# Patient Record
Sex: Female | Born: 1965 | Race: White | Hispanic: No | Marital: Married | Smoking: Never smoker
Health system: Southern US, Community
[De-identification: ages and names within clinical notes are randomized; demographics above are authoritative.]

## PROBLEM LIST (undated history)

## (undated) DIAGNOSIS — E0590 Thyrotoxicosis, unspecified without thyrotoxic crisis or storm: Secondary | ICD-10-CM

## (undated) DIAGNOSIS — IMO0002 Reserved for concepts with insufficient information to code with codable children: Secondary | ICD-10-CM

## (undated) DIAGNOSIS — J309 Allergic rhinitis, unspecified: Secondary | ICD-10-CM

## (undated) HISTORY — DX: Thyrotoxicosis, unspecified without thyrotoxic crisis or storm: E05.90

## (undated) HISTORY — DX: Reserved for concepts with insufficient information to code with codable children: IMO0002

## (undated) HISTORY — DX: Allergic rhinitis, unspecified: J30.9

---
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
## 1993-01-05 HISTORY — PX: APPENDECTOMY: SHX54

## 2003-07-20 ENCOUNTER — Other Ambulatory Visit: Admission: RE | Admit: 2003-07-20 | Discharge: 2003-07-20 | Payer: Self-pay | Admitting: Obstetrics and Gynecology

## 2004-01-06 LAB — HM MAMMOGRAPHY: HM Mammogram: NORMAL

## 2004-08-26 ENCOUNTER — Other Ambulatory Visit: Admission: RE | Admit: 2004-08-26 | Discharge: 2004-08-26 | Payer: Self-pay | Admitting: Obstetrics and Gynecology

## 2005-03-17 ENCOUNTER — Encounter: Admission: RE | Admit: 2005-03-17 | Discharge: 2005-03-17 | Payer: Self-pay | Admitting: Family Medicine

## 2005-09-29 ENCOUNTER — Other Ambulatory Visit: Admission: RE | Admit: 2005-09-29 | Discharge: 2005-09-29 | Payer: Self-pay | Admitting: Obstetrics and Gynecology

## 2005-10-08 ENCOUNTER — Encounter (INDEPENDENT_AMBULATORY_CARE_PROVIDER_SITE_OTHER): Payer: Self-pay | Admitting: Cardiology

## 2005-10-08 ENCOUNTER — Ambulatory Visit (HOSPITAL_COMMUNITY): Admission: RE | Admit: 2005-10-08 | Discharge: 2005-10-08 | Payer: Self-pay | Admitting: Obstetrics and Gynecology

## 2006-01-05 DIAGNOSIS — IMO0002 Reserved for concepts with insufficient information to code with codable children: Secondary | ICD-10-CM

## 2006-01-05 HISTORY — DX: Reserved for concepts with insufficient information to code with codable children: IMO0002

## 2006-03-16 ENCOUNTER — Inpatient Hospital Stay (HOSPITAL_COMMUNITY): Admission: AD | Admit: 2006-03-16 | Discharge: 2006-03-16 | Payer: Self-pay | Admitting: Obstetrics and Gynecology

## 2006-04-01 ENCOUNTER — Inpatient Hospital Stay (HOSPITAL_COMMUNITY): Admission: RE | Admit: 2006-04-01 | Discharge: 2006-04-04 | Payer: Self-pay | Admitting: Obstetrics and Gynecology

## 2007-01-07 ENCOUNTER — Encounter: Payer: Self-pay | Admitting: Internal Medicine

## 2007-06-06 LAB — CONVERTED CEMR LAB: Pap Smear: NORMAL

## 2008-05-18 ENCOUNTER — Ambulatory Visit: Payer: Self-pay | Admitting: Internal Medicine

## 2008-05-18 DIAGNOSIS — R519 Headache, unspecified: Secondary | ICD-10-CM | POA: Insufficient documentation

## 2008-05-18 DIAGNOSIS — L723 Sebaceous cyst: Secondary | ICD-10-CM | POA: Insufficient documentation

## 2008-05-18 DIAGNOSIS — B373 Candidiasis of vulva and vagina: Secondary | ICD-10-CM | POA: Insufficient documentation

## 2008-05-18 DIAGNOSIS — Z87448 Personal history of other diseases of urinary system: Secondary | ICD-10-CM | POA: Insufficient documentation

## 2008-05-18 DIAGNOSIS — F329 Major depressive disorder, single episode, unspecified: Secondary | ICD-10-CM | POA: Insufficient documentation

## 2008-05-18 DIAGNOSIS — R51 Headache: Secondary | ICD-10-CM | POA: Insufficient documentation

## 2008-07-05 LAB — CONVERTED CEMR LAB

## 2008-08-13 ENCOUNTER — Telehealth: Payer: Self-pay | Admitting: Internal Medicine

## 2008-08-22 ENCOUNTER — Encounter: Payer: Self-pay | Admitting: Internal Medicine

## 2009-10-09 ENCOUNTER — Ambulatory Visit: Payer: Self-pay | Admitting: Internal Medicine

## 2009-10-10 ENCOUNTER — Ambulatory Visit: Payer: Self-pay | Admitting: Internal Medicine

## 2009-10-10 ENCOUNTER — Encounter: Payer: Self-pay | Admitting: Internal Medicine

## 2009-10-10 LAB — CONVERTED CEMR LAB
ALT: 19 units/L (ref 0–35)
AST: 17 units/L (ref 0–37)
Albumin: 3.9 g/dL (ref 3.5–5.2)
Alkaline Phosphatase: 51 units/L (ref 39–117)
BUN: 12 mg/dL (ref 6–23)
Basophils Absolute: 0 10*3/uL (ref 0.0–0.1)
Basophils Relative: 0.5 % (ref 0.0–3.0)
Bilirubin Urine: NEGATIVE
Bilirubin, Direct: 0.2 mg/dL (ref 0.0–0.3)
CO2: 32 meq/L (ref 19–32)
Calcium: 9.6 mg/dL (ref 8.4–10.5)
Chloride: 103 meq/L (ref 96–112)
Cholesterol: 157 mg/dL (ref 0–200)
Creatinine, Ser: 0.7 mg/dL (ref 0.4–1.2)
Eosinophils Absolute: 0.1 10*3/uL (ref 0.0–0.7)
Eosinophils Relative: 2.1 % (ref 0.0–5.0)
GFR calc non Af Amer: 100.62 mL/min (ref >60)
Glucose, Bld: 89 mg/dL (ref 70–99)
HCT: 38 % (ref 36.0–46.0)
HDL: 76.7 mg/dL (ref >39.00)
Hemoglobin, Urine: NEGATIVE
Hemoglobin: 13.3 g/dL (ref 12.0–15.0)
Ketones, ur: NEGATIVE mg/dL
LDL Cholesterol: 66 mg/dL (ref 0–99)
Leukocytes, UA: NEGATIVE
Lymphocytes Relative: 32.2 % (ref 12.0–46.0)
Lymphs Abs: 1.6 10*3/uL (ref 0.7–4.0)
MCHC: 34.9 g/dL (ref 30.0–36.0)
MCV: 91.7 fL (ref 78.0–100.0)
Monocytes Absolute: 0.6 10*3/uL (ref 0.1–1.0)
Monocytes Relative: 10.8 % (ref 3.0–12.0)
Neutro Abs: 2.8 10*3/uL (ref 1.4–7.7)
Neutrophils Relative %: 54.4 % (ref 43.0–77.0)
Nitrite: NEGATIVE
Platelets: 295 10*3/uL (ref 150.0–400.0)
Potassium: 5.5 meq/L — ABNORMAL HIGH (ref 3.5–5.1)
RBC: 4.14 M/uL (ref 3.87–5.11)
RDW: 12.6 % (ref 11.5–14.6)
Sodium: 141 meq/L (ref 135–145)
Specific Gravity, Urine: 1.01 (ref 1.000–1.030)
TSH: 0.03 microintl units/mL — ABNORMAL LOW (ref 0.35–5.50)
Total Bilirubin: 1.2 mg/dL (ref 0.3–1.2)
Total CHOL/HDL Ratio: 2
Total Protein, Urine: NEGATIVE mg/dL
Total Protein: 7 g/dL (ref 6.0–8.3)
Triglycerides: 73 mg/dL (ref 0.0–149.0)
Urine Glucose: NEGATIVE mg/dL
Urobilinogen, UA: 0.2 (ref 0.0–1.0)
VLDL: 14.6 mg/dL (ref 0.0–40.0)
WBC: 5.1 10*3/uL (ref 4.5–10.5)
pH: 8.5 (ref 5.0–8.0)

## 2009-10-14 ENCOUNTER — Telehealth (INDEPENDENT_AMBULATORY_CARE_PROVIDER_SITE_OTHER): Payer: Self-pay | Admitting: *Deleted

## 2009-10-14 LAB — CONVERTED CEMR LAB
Free T4: 1.58 ng/dL (ref 0.60–1.60)
T3, Free: 3.9 pg/mL (ref 2.3–4.2)

## 2009-10-28 ENCOUNTER — Ambulatory Visit: Payer: Self-pay | Admitting: Endocrinology

## 2009-10-28 DIAGNOSIS — E0590 Thyrotoxicosis, unspecified without thyrotoxic crisis or storm: Secondary | ICD-10-CM | POA: Insufficient documentation

## 2009-10-28 HISTORY — DX: Thyrotoxicosis, unspecified without thyrotoxic crisis or storm: E05.90

## 2009-11-26 ENCOUNTER — Ambulatory Visit: Payer: Self-pay | Admitting: Endocrinology

## 2009-11-26 LAB — CONVERTED CEMR LAB
Free T4: 1.61 ng/dL — ABNORMAL HIGH (ref 0.60–1.60)
TSH: 0.02 microintl units/mL — ABNORMAL LOW (ref 0.35–5.50)

## 2009-12-03 ENCOUNTER — Telehealth: Payer: Self-pay | Admitting: Endocrinology

## 2010-01-09 ENCOUNTER — Other Ambulatory Visit: Payer: Self-pay | Admitting: Internal Medicine

## 2010-01-09 ENCOUNTER — Ambulatory Visit
Admission: RE | Admit: 2010-01-09 | Discharge: 2010-01-09 | Payer: Self-pay | Source: Home / Self Care | Attending: Internal Medicine | Admitting: Internal Medicine

## 2010-01-09 ENCOUNTER — Telehealth: Payer: Self-pay | Admitting: Internal Medicine

## 2010-01-09 LAB — T4, FREE: Free T4: 0.89 ng/dL (ref 0.60–1.60)

## 2010-01-09 LAB — TSH: TSH: 0.02 u[IU]/mL — ABNORMAL LOW (ref 0.35–5.50)
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
## 2010-02-05 NOTE — Progress Notes (Signed)
----   Converted from flag ---- ---- 10/14/2009 2:05 PM, Ivar Bury wrote: Gave pt 10/28/09 @ 11a w/Dr Allie Dimmer  ---- 10/14/2009 9:30 AM, Dagoberto Reef wrote: Please schedule with Dr Everardo All.  Thanks  ---- 10/14/2009 8:37 AM, Newt Lukes MD wrote: The following orders have been entered for this patient and placed on Admin Hold:  Type:     Referral       Code:   Endocrine Description:   Endocrinology Referral Order Date:   10/14/2009   Authorized By:   Newt Lukes MD Order #:   [id number] Clinical Notes:   ellison - eval low TSH with normal FT4 - ?tx ------------------------------
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
## 2010-02-05 NOTE — Progress Notes (Signed)
Summary: Pharmacy change  Prescriptions: METHIMAZOLE 5 MG TABS (METHIMAZOLE) 1 tab once daily  #90 x 0   Entered by:   Margaret Pyle, CMA   Authorized by:   Minus Breeding MD   Signed by:   Margaret Pyle, CMA on 12/03/2009   Method used:   Electronically to        [id number] (retail)       [address]       [place], [place]  [address]       Ph: [phone] or [phone]       Fax: [phone]   RxID:   [id number]
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
## 2010-02-06 NOTE — Progress Notes (Signed)
Summary: Jennifer Allen  Phone Note Call from Patient Call back at Home Phone [phone]   Caller: Patient Summary of Call: Pt called stating that she had sinus pressure with green nasal discharge and mild ear pain. Pt is sure she has sinus infection but is unable to come in for OV today. Pt is going out of town this afternoon and is requesting that Dr Felicity Coyer please consider this and Rx ABX fro her today. Please advise Initial call taken by: Margaret Pyle, CMA,  January 09, 2010 9:35 AM  Follow-up for Phone Call        we do not typically rx abx without OV - but erx for zpak done given circumstances - if not improved after tx, needs OV, sooner if symptoms worse - thanks Follow-up by: Newt Lukes MD,  January 09, 2010 9:48 AM  Additional Follow-up for Phone Call Additional follow up Details #1::        seen in OV - see same today -  Additional Follow-up by: Newt Lukes MD,  January 09, 2010 10:52 AM    New/Updated Medications: AZITHROMYCIN 250 MG TABS (AZITHROMYCIN) 2 tabs by mouth today, then 1 by mouth daily starting tomorrow Prescriptions: AZITHROMYCIN 250 MG TABS (AZITHROMYCIN) 2 tabs by mouth today, then 1 by mouth daily starting tomorrow  #6 x 0   Entered and Authorized by:   Newt Lukes MD   Signed by:   Newt Lukes MD on 01/09/2010   Method used:   Electronically to        [id number] (retail)       [address]       [place], [place]  [address]       Ph: [phone] or [phone]       Fax: [phone]   RxID:   [id number]

## 2010-02-26 ENCOUNTER — Telehealth (INDEPENDENT_AMBULATORY_CARE_PROVIDER_SITE_OTHER): Payer: Self-pay | Admitting: *Deleted

## 2010-02-27 ENCOUNTER — Encounter (INDEPENDENT_AMBULATORY_CARE_PROVIDER_SITE_OTHER): Payer: Self-pay | Admitting: *Deleted

## 2010-02-27 ENCOUNTER — Other Ambulatory Visit: Payer: BC Managed Care – PPO

## 2010-02-27 ENCOUNTER — Other Ambulatory Visit: Payer: Self-pay | Admitting: Endocrinology

## 2010-02-27 DIAGNOSIS — E0591 Thyrotoxicosis, unspecified with thyrotoxic crisis or storm: Secondary | ICD-10-CM

## 2010-02-27 LAB — TSH: TSH: 0.03 u[IU]/mL — ABNORMAL LOW (ref 0.35–5.50)

## 2010-02-27 LAB — T4, FREE: Free T4: 0.77 ng/dL (ref 0.60–1.60)

## 2010-02-28 ENCOUNTER — Ambulatory Visit (INDEPENDENT_AMBULATORY_CARE_PROVIDER_SITE_OTHER): Payer: BC Managed Care – PPO | Admitting: Endocrinology

## 2010-02-28 ENCOUNTER — Encounter: Payer: Self-pay | Admitting: Endocrinology

## 2010-02-28 DIAGNOSIS — E0590 Thyrotoxicosis, unspecified without thyrotoxic crisis or storm: Secondary | ICD-10-CM

## 2010-03-04 NOTE — Progress Notes (Signed)
  Phone Note Call from Patient Call back at Home Phone [phone]   Caller: Patient---[phone] Call For: Dr Everardo All Summary of Call: Pt made appt for 2/24 for f/u, pt states she needs labs prior, what labs would you like her to have before 2/24?Please let pt know that she can get labs. Initial call taken by: Verdell Face,  February 26, 2010 11:22 AM  Follow-up for Phone Call        free t4 and tsh 242.9 Follow-up by: Minus Breeding MD,  February 26, 2010 1:36 PM  Additional Follow-up for Phone Call Additional follow up Details #1::        Elnita Maxwell can you sechedule these labs for this pt? Thanks! Additional Follow-up by: Margaret Pyle, CMA,  February 26, 2010 1:44 PM    Additional Follow-up for Phone Call Additional follow up Details #2::    orders in [facility] for 2/23, pt aware. Follow-up by: Verdell Face,  February 26, 2010 2:06 PM
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
## 2010-04-25 ENCOUNTER — Other Ambulatory Visit: Payer: BC Managed Care – PPO

## 2010-04-29 ENCOUNTER — Ambulatory Visit: Payer: BC Managed Care – PPO | Admitting: Endocrinology

## 2010-05-23 NOTE — Discharge Summary (Signed)
NAMEAINO, HECKERT               ACCOUNT NO.:  [id number]   MEDICAL RECORD NO.:  [id number]          PATIENT TYPE:  INP   LOCATION:  9130                          FACILITY:  [facility]   PHYSICIAN:  Huel Cote, M.D. DATE OF BIRTH:  July 11, 1965   DATE OF ADMISSION:  04/01/2006  DATE OF DISCHARGE:  04/04/2006                               DISCHARGE SUMMARY   DISCHARGE DIAGNOSES:  1. Term pregnancy at 39 weeks, delivered.  2. Persistent breech presentation failing external version.  3. Status post primary low transverse cesarean section with double      layer closure of uterus.   DISCHARGE MEDICATIONS:  1. Motrin 600 mg p.o. q.6h.  2. Percocet 1-2 tablets p.o. q.4h. p.r.n.  3. Iron sulfate 325 mg p.o. daily.   DISCHARGE FOLLOWUP:  Patient is to follow up in my office in 2 weeks for  an incision check.   HOSPITAL COURSE:  The patient is a 45 year old G1, P0, who came in at 39  weeks for scheduled cesarean section given persistent breech  presentation.  For her full History and Physical, please see History and  Physical previously dictated.  She underwent a low transverse cesarean  section on April 01, 2006, and was delivered of a viable female infant  from the breech presentation.  Apgars were 8 and 9, weight was 7 pounds,  5 ounces.  She had normal ovaries and tubes noted.  The uterus was  normal except for a small serosal fibroid.  She was then admitted for  routine postoperative care.  On postop day #2 she was ambulating,  tolerating a regular diet and feeling well.  Her hemoglobin went from  12.3 down to 8.7, which was not unexpected given a slightly excessive  blood loss at the time of C-section, with several large sinuses bleeding  on the anterior surface of the uterus.  She was placed on iron sulfate  and continued her postop recovery.  By postop day #3, she was doing  quite well, her pain was controlled, she was voiding without difficulty  and tolerating a regular diet.   Her incision was clear.  Her staples  were removed and Steri-Strips placed and it was decided that she was  stable for discharge home.  She was discharged with followup in my  office planned in 2 weeks for an incision check.      Huel Cote, M.D.  Electronically Signed     KR/MEDQ  D:  04/04/2006  T:  04/04/2006  Job:  [id number]

## 2010-05-23 NOTE — Op Note (Signed)
Jennifer Allen, Jennifer Allen               ACCOUNT NO.:  [id number]   MEDICAL RECORD NO.:  [id number]          PATIENT TYPE:  INP   LOCATION:  9130                          [facility]:  [facility]   PHYSICIAN:  Huel Cote, M.D. DATE OF BIRTH:  [birth year]   DATE OF PROCEDURE:  DATE OF DISCHARGE:                               OPERATIVE REPORT   PREOPERATIVE DIAGNOSES:  1. Term pregnancy at 39+ weeks.  2. Breech presentation.   POSTOPERATIVE DIAGNOSES:  1. Term pregnancy at 39+ weeks.  2. Breech presentation.   PROCEDURE:  Primary low transverse C-section with double layer closure  of the uterus.   SURGEON:  Dr. Huel Cote   ASSISTANT:  Dr. Tracey Harries   ANESTHESIA:  Spinal.   SPECIMENS:  Placenta was sent to labor.   ESTIMATED BLOOD LOSS:  800 mL.   URINE OUTPUT:  400 mL clear.   IV FLUIDS:  3 liters of LR.   FINDINGS:  Viable female infant in the breech presentation.  Apgars were  8 and 9.  Weight 7 pounds 5 ounces.  Normal ovaries were noted.  The  uterus had a normal cavity.  There was 1 small serosal fibroid palpated  and was approximately 1-2 cm in size.   PROCEDURE:  The patient was taken to the operating room where spinal  anesthesia was obtained without difficulty.  She was then prepped and  draped in the normal sterile fashion in the dorsal supine position with  a leftward tilt.  A Pfannenstiel skin incision was then made  approximately 2 cm above the symphysis pubis and carried through to the  underlying layer of fascia by sharp dissection and Bovie cautery.  Fascia was opened in the midline, and the incision was extended  laterally with Mayo scissors.  The inferior aspect of the incision was  grasped with Kocher clamps, elevated, and dissected off the underlying  rectus muscles.  The superior aspect was likewise elevated and dissected  off the rectus muscles.  The rectus muscles were separated in the  midline and the peritoneal cavity entered bluntly.   Peritoneal incision  was then made, extended both superiorly and inferiorly with careful  attention to avoid both bowel and bladder.  The inferior aspect of the  incision was then incised with the scalpel and the bladder flap pushed  away.  There were several large sinuses on the anterior surface of the  uterus which did bleed a little more heavily than usual; however, the  cavity itself was able to be entered bluntly, and the uterine incision  was extended bluntly.  The infant was delivered in the breech  presentation with the legs carefully reduced as well as the arms reduced  in a forward sweep across the chest.  The head was delivered in a flexed  motion, the infant bulb suctioned, the cord clamped and the infant  handed to the awaiting pediatricians.  The cord blood was then obtained  and the placenta delivered manually given that there was some excessive  bleeding from the uterine sinuses.  The uterus itself was then incised  with good  contraction, and the uterine incision was closed in 2 layers,  the first a running locked layer of 0 chromic, the second an imbricating  layer of the same suture.  At this point, the incision was hemostatic.  There was no excessive active bleeding noted.  The ovaries and tubes  were inspected and found to be normal with a small serosal fibroid that  was noted on the anterior surface of the fundus.  The remainder of the  pelvis appeared normal.  Therefore, all instruments and sponges were  removed from the pelvis as well as the retractor.  The rectus muscles  were then reapproximated with several mattress sutures of 0 Vicryl in an  interrupted fashion.  The fascia was closed with 0 Vicryl in running  fashion.  The skin was closed with staples.  Sponge, lap, and needle  counts were correct.  The patient was taken to the recovery room in  stable condition.      Huel Cote, M.D.  Electronically Signed     KR/MEDQ  D:  04/01/2006  T:   04/01/2006  Job:  [id number]

## 2010-05-23 NOTE — H&P (Signed)
Jennifer Allen, Jennifer Allen NO.:  [id number]   MEDICAL RECORD NO.:  [id number]          PATIENT TYPE:  INP   LOCATION:  NA                            [facility]:  [facility]   PHYSICIAN:  Huel Cote, M.D. DATE OF BIRTH:  Sep 06, 1965   DATE OF ADMISSION:  DATE OF DISCHARGE:                              HISTORY & PHYSICAL   Surgery to take place on March 27th, 2008 at the [facility]  fac[facility].   Patient is a 45 year old G1, P0, who is coming in at 39+ weeks with a  estimated due date of April 03, 2006 for a scheduled C-section, given a  persistent breech presentation which failed external cephalic version.  Patient's prenatal care otherwise has been uncomplicated.  She has no  past obstetrical history.  Her prenatal labs are as follows:  AB  positive, antibody negative, RPR nonreactive, Rubella immune, Hepatitis  B surface antigen negative, HIV declined.  GC negative, chlamydia  negative, group B strep negative.  One-hour Glucola 115.   PAST MEDICAL HISTORY:  The patient had a possible history of mitral  valve prolapse, however, we did have a echocardiogram performed in  October which was normal and demonstrated no mitral valve prolapse.  She  also had a history of depression; however, had done well off of her  medications throughout the pregnancy.   PAST SURGICAL HISTORY:  In 1995, she had an appendectomy and in 1985 a  tonsillectomy.   PAST GYNECOLOGICAL HISTORY:  None with no abnormal Pap smears.   PHYSICAL EXAMINATION:  VITAL SIGNS:  Patient is 172 pounds, blood  pressure 110/80.  CARDIAC:  Regular rate and rhythm.  LUNGS:  Clear.  ABDOMEN:  Soft, gravid, nontender.  Fundal height is 36.  Cervix is 50  closed and a high station.   The patient was counseled as to the risks and benefits of proceeding  with C-section, after she failed an attempted version at approximately  37 weeks.  The patient understands the risks for bleeding, infection and  possible  damage to bowel and bladder; however, realized that this is the  safest approach for delivery of her breech baby.  We will continue to  follow the patient until the day of the surgery, and if the baby  spontaneously rotates to vertex, obviously would be a candidate for  labor; however, if she remains breech, we will proceed with a primary C-  section as stated.      Huel Cote, M.D.  Electronically Signed     KR/MEDQ  D:  03/25/2006  T:  03/25/2006  Job:  [id number]

## 2010-09-17 ENCOUNTER — Telehealth: Payer: Self-pay | Admitting: *Deleted

## 2010-09-17 NOTE — Telephone Encounter (Signed)
Per MD, pt is due for F/U OV (received lab work from OB/GYN). Left message for pt to callback office.

## 2010-09-18 NOTE — Telephone Encounter (Signed)
Left message for pt to callback office.  

## 2010-09-19 NOTE — Telephone Encounter (Signed)
Left message for pt to callback office.  

## 2010-09-24 NOTE — Telephone Encounter (Signed)
Pt states that she cannot afford to come in for OV at this current time and that she will callback when she is able to.

## 2010-10-14 ENCOUNTER — Other Ambulatory Visit: Payer: Self-pay | Admitting: Internal Medicine

## 2011-01-06 LAB — HM PAP SMEAR

## 2011-05-26 ENCOUNTER — Telehealth: Payer: Self-pay | Admitting: *Deleted

## 2011-05-26 DIAGNOSIS — Z0000 Encounter for general adult medical examination without abnormal findings: Secondary | ICD-10-CM

## 2011-05-26 DIAGNOSIS — E0590 Thyrotoxicosis, unspecified without thyrotoxic crisis or storm: Secondary | ICD-10-CM

## 2011-05-26 NOTE — Telephone Encounter (Signed)
Called pt back no answer LMOM labs entered in epic can come 3 days prior to cpx appt...05/26/11@1 :51pm/lmb

## 2011-05-26 NOTE — Telephone Encounter (Signed)
ok done

## 2011-05-26 NOTE — Telephone Encounter (Signed)
Pt cpx for august wanting to get T4 and T3 added to cpx labs...05/26/11@2 :36pm/LMB

## 2011-05-26 NOTE — Telephone Encounter (Signed)
Message copied by Deatra James on Tue May 26, 2011  1:45 PM ------      Message from: Newell Coral      Created: Tue May 26, 2011  1:32 PM      Regarding: cpe sch       This pt scheduled her cpe and is hoping to get her labs before

## 2011-05-26 NOTE — Telephone Encounter (Signed)
Pt has been notified... 05/26/11@3 :36pm/LMB

## 2011-06-23 ENCOUNTER — Encounter: Payer: Self-pay | Admitting: Internal Medicine

## 2011-06-29 ENCOUNTER — Other Ambulatory Visit: Payer: Self-pay

## 2011-06-29 MED ORDER — FLUOXETINE HCL 20 MG PO TABS: 20.0000 mg | ORAL_TABLET | Freq: Every day | ORAL | Status: DC

## 2011-07-06 ENCOUNTER — Telehealth: Payer: Self-pay | Admitting: Internal Medicine

## 2011-07-06 NOTE — Telephone Encounter (Signed)
Pt calling because she has made an appt but it is not until August. She only has 2 pills left of Fluoxetine. She wants to know if she can get a rx to local pharmacy to last until appt. Can send to [facility] [address]

## 2011-07-07 MED ORDER — FLUOXETINE HCL 20 MG PO TABS: 20.0000 mg | ORAL_TABLET | Freq: Every day | ORAL | Status: DC

## 2011-08-04 ENCOUNTER — Other Ambulatory Visit (INDEPENDENT_AMBULATORY_CARE_PROVIDER_SITE_OTHER): Payer: BC Managed Care – PPO

## 2011-08-04 DIAGNOSIS — E0590 Thyrotoxicosis, unspecified without thyrotoxic crisis or storm: Secondary | ICD-10-CM

## 2011-08-04 DIAGNOSIS — Z0000 Encounter for general adult medical examination without abnormal findings: Secondary | ICD-10-CM

## 2011-08-04 LAB — CBC WITH DIFFERENTIAL/PLATELET
Basophils Relative: 0.4 % (ref 0.0–3.0)
Eosinophils Relative: 2.4 % (ref 0.0–5.0)
HCT: 39 % (ref 36.0–46.0)
Hemoglobin: 12.9 g/dL (ref 12.0–15.0)
Lymphs Abs: 2.1 10*3/uL (ref 0.7–4.0)
MCV: 93.2 fl (ref 78.0–100.0)
Monocytes Absolute: 0.7 10*3/uL (ref 0.1–1.0)
Monocytes Relative: 8.8 % (ref 3.0–12.0)
Neutro Abs: 4.4 10*3/uL (ref 1.4–7.7)
WBC: 7.4 10*3/uL (ref 4.5–10.5)

## 2011-08-04 LAB — TSH: TSH: 0.18 u[IU]/mL — ABNORMAL LOW (ref 0.35–5.50)

## 2011-08-04 LAB — LIPID PANEL
Cholesterol: 186 mg/dL (ref 0–200)
LDL Cholesterol: 84 mg/dL (ref 0–99)
VLDL: 30.6 mg/dL (ref 0.0–40.0)

## 2011-08-04 LAB — URINALYSIS, ROUTINE W REFLEX MICROSCOPIC
Bilirubin Urine: NEGATIVE
Ketones, ur: NEGATIVE
Leukocytes, UA: NEGATIVE
Urobilinogen, UA: 0.2 (ref 0.0–1.0)
pH: 6 (ref 5.0–8.0)

## 2011-08-04 LAB — BASIC METABOLIC PANEL
BUN: 18 mg/dL (ref 6–23)
Chloride: 101 mEq/L (ref 96–112)
GFR: 79.26 mL/min (ref >60.00)
Potassium: 4.2 mEq/L (ref 3.5–5.1)
Sodium: 139 mEq/L (ref 135–145)

## 2011-08-04 LAB — HEPATIC FUNCTION PANEL
ALT: 15 U/L (ref 0–35)
AST: 12 U/L (ref 0–37)
Albumin: 4 g/dL (ref 3.5–5.2)
Total Protein: 7.2 g/dL (ref 6.0–8.3)

## 2011-08-04 LAB — T4, FREE: Free T4: 0.84 ng/dL (ref 0.60–1.60)

## 2011-08-06 ENCOUNTER — Encounter: Payer: Self-pay | Admitting: Internal Medicine

## 2011-08-06 ENCOUNTER — Ambulatory Visit (INDEPENDENT_AMBULATORY_CARE_PROVIDER_SITE_OTHER): Payer: BC Managed Care – PPO | Admitting: Internal Medicine

## 2011-08-06 VITALS — BP 102/72 | HR 80 | Temp 98.9°F | Ht 65.5 in | Wt 166.1 lb

## 2011-08-06 DIAGNOSIS — E0590 Thyrotoxicosis, unspecified without thyrotoxic crisis or storm: Secondary | ICD-10-CM

## 2011-08-06 DIAGNOSIS — Z1239 Encounter for other screening for malignant neoplasm of breast: Secondary | ICD-10-CM

## 2011-08-06 DIAGNOSIS — Z0000 Encounter for general adult medical examination without abnormal findings: Secondary | ICD-10-CM

## 2011-08-06 DIAGNOSIS — E049 Nontoxic goiter, unspecified: Secondary | ICD-10-CM

## 2011-08-06 MED ORDER — FLUOXETINE HCL 20 MG PO TABS: 20.0000 mg | ORAL_TABLET | Freq: Every day | ORAL | Status: DC

## 2011-08-06 NOTE — Assessment & Plan Note (Signed)
On MMZ 2012 but stopped independently T3/FT4 normal despite mildly suppressed TSH Check thyroid ultrasound given goiter on exam and monitor labs q6-61mo

## 2011-08-06 NOTE — Progress Notes (Signed)
Subjective:    Patient ID: Jennifer Allen, female    DOB: 12-Mar-1965, 46 y.o.   MRN: [id number]  HPI  patient is here today for annual physical. Patient feels well and has no complaints.  Past Medical History  Diagnosis Date  . HYPERTHYROIDISM 10/28/2009    incidental CPX dx  . POSTPARTUM DEPRESSION 2008  . Allergic rhinitis, cause unspecified    Family History  Problem Relation Age of Onset  . Arthritis Mother   . Hypothyroidism Mother   . Fibromyalgia Mother   . Arthritis Father   . Alcohol abuse Other   . Arthritis Other   . Prostate cancer Other    History  Substance Use Topics  . Smoking status: Never Smoker   . Smokeless tobacco: Not on file   Comment: Married, lives with spouse +dtr. Artist, master degree  . Alcohol Use: No     Review of Systems Constitutional: Negative for fever or weight change.  Respiratory: Negative for cough and shortness of breath.   Cardiovascular: Negative for chest pain or palpitations.  Gastrointestinal: Negative for abdominal pain, no bowel changes.  Musculoskeletal: Negative for gait problem or joint swelling.  Skin: Negative for rash.  Neurological: Negative for dizziness or headache.  No other specific complaints in a complete review of systems (except as listed in HPI above).     Objective:   Physical Exam BP 102/72  Pulse 80  Temp 98.9 F (37.2 C) (Oral)  Ht 5' 5.5" (1.664 m)  Wt 166 lb 2 oz (75.354 kg)  BMI 27.22 kg/m2  SpO2 97%  LMP 07/06/2011 Wt Readings from Last 3 Encounters:  08/06/11 166 lb 2 oz (75.354 kg)  02/28/10 166 lb 8 oz (75.524 kg)  01/09/10 157 lb (71.215 kg)   Constitutional: She appears well-developed and well-nourished. No distress.  HENT: Head: Normocephalic and atraumatic. Ears: B TMs ok, no erythema or effusion; Nose: Nose normal. Mouth/Throat: Oropharynx is clear and moist. No oropharyngeal exudate.  Eyes: Conjunctivae and EOM are normal. Pupils are equal, round, and reactive to light. No  scleral icterus.  Neck: Normal range of motion. Neck supple. No JVD present. Goiter R>L, nontender.  Cardiovascular: Normal rate, regular rhythm and normal heart sounds.  No murmur heard. No BLE edema. Pulmonary/Chest: Effort normal and breath sounds normal. No respiratory distress. She has no wheezes.  Abdominal: Soft. Bowel sounds are normal. She exhibits no distension. There is no tenderness. no masses GU: defer to gyn Musculoskeletal: Normal range of motion, no joint effusions. No gross deformities Neurological: She is alert and oriented to person, place, and time. No cranial nerve deficit. Coordination normal.  Skin: Skin is warm and dry. No rash noted. No erythema.  Psychiatric: She has a normal mood and affect. Her behavior is normal. Judgment and thought content normal.   Lab Results  Component Value Date   WBC 7.4 08/04/2011   HGB 12.9 08/04/2011   HCT 39.0 08/04/2011   PLT 331.0 08/04/2011   GLUCOSE 87 08/04/2011   CHOL 186 08/04/2011   TRIG 153.0* 08/04/2011   HDL 71.30 08/04/2011   LDLCALC 84 08/04/2011   ALT 15 08/04/2011   AST 12 08/04/2011   NA 139 08/04/2011   K 4.2 08/04/2011   CL 101 08/04/2011   CREATININE 0.8 08/04/2011   BUN 18 08/04/2011   CO2 29 08/04/2011   TSH 0.18* 08/04/2011        Assessment & Plan:  CPX/v70.0 - Patient has been counseled on age-appropriate routine  health concerns for screening and prevention. These are reviewed and up-to-date. Immunizations are up-to-date or declined. Labs reviewed.

## 2011-08-06 NOTE — Patient Instructions (Addendum)
It was good to see you today. We have reviewed your interval history today Health Maintenance reviewed - all recommended immunizations and age-appropriate screenings are up-to-date. Labs look great - will watch OFF thyroid meds for now we'll make referral for thyroid ultrasound and for mammogram screening. Our office will contact you regarding appointment(s) once made. Medications reviewed, no changes at this time. Refill on medication(s) today.  Please schedule followup in 1 year for medical physical and labs, call sooner if problems.  Health Maintenance, Females A healthy lifestyle and preventative care can promote health and wellness.  Maintain regular health, dental, and eye exams.   Eat a healthy diet. Foods like vegetables, fruits, whole grains, low-fat dairy products, and lean protein foods contain the nutrients you need without too many calories. Decrease your intake of foods high in solid fats, added sugars, and salt. Get information about a proper diet from your caregiver, if necessary.   Regular physical exercise is one of the most important things you can do for your health. Most adults should get at least 150 minutes of moderate-intensity exercise (any activity that increases your heart rate and causes you to sweat) each week. In addition, most adults need muscle-strengthening exercises on 2 or more days a week.     Maintain a healthy weight. The body mass index (BMI) is a screening tool to identify possible weight problems. It provides an estimate of body fat based on height and weight. Your caregiver can help determine your BMI, and can help you achieve or maintain a healthy weight. For adults 20 years and older:   A BMI below 18.5 is considered underweight.   A BMI of 18.5 to 24.9 is normal.   A BMI of 25 to 29.9 is considered overweight.   A BMI of 30 and above is considered obese.   Maintain normal blood lipids and cholesterol by exercising and minimizing your intake of  saturated fat. Eat a balanced diet with plenty of fruits and vegetables. Blood tests for lipids and cholesterol should begin at age 37 and be repeated every 5 years. If your lipid or cholesterol levels are high, you are over 50, or you are a high risk for heart disease, you may need your cholesterol levels checked more frequently. Ongoing high lipid and cholesterol levels should be treated with medicines if diet and exercise are not effective.   If you smoke, find out from your caregiver how to quit. If you do not use tobacco, do not start.   If you are pregnant, do not drink alcohol. If you are breastfeeding, be very cautious about drinking alcohol. If you are not pregnant and choose to drink alcohol, do not exceed 1 drink per day. One drink is considered to be 12 ounces (355 mL) of beer, 5 ounces (148 mL) of wine, or 1.5 ounces (44 mL) of liquor.   Avoid use of street drugs. Do not share needles with anyone. Ask for help if you need support or instructions about stopping the use of drugs.   High blood pressure causes heart disease and increases the risk of stroke. Blood pressure should be checked at least every 1 to 2 years. Ongoing high blood pressure should be treated with medicines, if weight loss and exercise are not effective.   If you are 51 to 44 years old, ask your caregiver if you should take aspirin to prevent strokes.   Diabetes screening involves taking a blood sample to check your fasting blood sugar level. This should  be done once every 3 years, after age 25, if you are within normal weight and without risk factors for diabetes. Testing should be considered at a younger age or be carried out more frequently if you are overweight and have at least 1 risk factor for diabetes.   Breast cancer screening is essential preventative care for women. You should practice "breast self-awareness." This means understanding the normal appearance and feel of your breasts and may include breast  self-examination. Any changes detected, no matter how small, should be reported to a caregiver. Women in their 74s and 30s should have a clinical breast exam (CBE) by a caregiver as part of a regular health exam every 1 to 3 years. After age 40, women should have a CBE every year. Starting at age 57, women should consider having a mammogram (breast X-ray) every year. Women who have a family history of breast cancer should talk to their caregiver about genetic screening. Women at a high risk of breast cancer should talk to their caregiver about having an MRI and a mammogram every year.   The Pap test is a screening test for cervical cancer. Women should have a Pap test starting at age 75. Between ages 79 and 64, Pap tests should be repeated every 2 years. Beginning at age 27, you should have a Pap test every 3 years as long as the past 3 Pap tests have been normal. If you had a hysterectomy for a problem that was not cancer or a condition that could lead to cancer, then you no longer need Pap tests. If you are between ages 51 and 65, and you have had normal Pap tests going back 10 years, you no longer need Pap tests. If you have had past treatment for cervical cancer or a condition that could lead to cancer, you need Pap tests and screening for cancer for at least 20 years after your treatment. If Pap tests have been discontinued, risk factors (such as a new sexual partner) need to be reassessed to determine if screening should be resumed. Some women have medical problems that increase the chance of getting cervical cancer. In these cases, your caregiver may recommend more frequent screening and Pap tests.   The human papillomavirus (HPV) test is an additional test that may be used for cervical cancer screening. The HPV test looks for the virus that can cause the cell changes on the cervix. The cells collected during the Pap test can be tested for HPV. The HPV test could be used to screen women aged 32 years and  older, and should be used in women of any age who have unclear Pap test results. After the age of 74, women should have HPV testing at the same frequency as a Pap test.   Colorectal cancer can be detected and often prevented. Most routine colorectal cancer screening begins at the age of 15 and continues through age 64. However, your caregiver may recommend screening at an earlier age if you have risk factors for colon cancer. On a yearly basis, your caregiver may provide home test kits to check for hidden blood in the stool. Use of a small camera at the end of a tube, to directly examine the colon (sigmoidoscopy or colonoscopy), can detect the earliest forms of colorectal cancer. Talk to your caregiver about this at age 2, when routine screening begins. Direct examination of the colon should be repeated every 5 to 10 years through age 34, unless early forms of pre-cancerous polyps  or small growths are found.   Hepatitis C blood testing is recommended for all people born from [birth year] through 1963 and any individual with known risks for hepatitis C.   Practice safe sex. Use condoms and avoid high-risk sexual practices to reduce the spread of sexually transmitted infections (STIs). Sexually active women aged 63 and younger should be checked for Chlamydia, which is a common sexually transmitted infection. Older women with new or multiple partners should also be tested for Chlamydia. Testing for other STIs is recommended if you are sexually active and at increased risk.   Osteoporosis is a disease in which the bones lose minerals and strength with aging. This can result in serious bone fractures. The risk of osteoporosis can be identified using a bone density scan. Women ages 70 and over and women at risk for fractures or osteoporosis should discuss screening with their caregivers. Ask your caregiver whether you should be taking a calcium supplement or vitamin D to reduce the rate of osteoporosis.   Menopause can  be associated with physical symptoms and risks. Hormone replacement therapy is available to decrease symptoms and risks. You should talk to your caregiver about whether hormone replacement therapy is right for you.   Use sunscreen with a sun protection factor (SPF) of 30 or greater. Apply sunscreen liberally and repeatedly throughout the day. You should seek shade when your shadow is shorter than you. Protect yourself by wearing long sleeves, pants, a wide-brimmed hat, and sunglasses year round, whenever you are outdoors.   Notify your caregiver of new moles or changes in moles, especially if there is a change in shape or color. Also notify your caregiver if a mole is larger than the size of a pencil eraser.   Stay current with your immunizations.  Document Released: 07/07/2010 Document Revised: 12/11/2010 Document Reviewed: 07/07/2010 [facility] Patient Information 2012 [place], [place].

## 2011-08-18 ENCOUNTER — Ambulatory Visit
Admission: RE | Admit: 2011-08-18 | Discharge: 2011-08-18 | Disposition: A | Discharge status: Home / Self Care | Payer: BC Managed Care – PPO | Source: Ambulatory Visit | Attending: Internal Medicine | Admitting: Internal Medicine

## 2011-08-18 DIAGNOSIS — E0590 Thyrotoxicosis, unspecified without thyrotoxic crisis or storm: Secondary | ICD-10-CM

## 2011-08-19 DIAGNOSIS — E049 Nontoxic goiter, unspecified: Secondary | ICD-10-CM | POA: Insufficient documentation

## 2011-08-19 NOTE — Addendum Note (Signed)
Addended by: Rene Paci A on: 08/19/2011 09:06 AM   Modules accepted: Orders

## 2011-08-20 NOTE — Addendum Note (Signed)
Addended by: Rene Paci A on: 08/20/2011 08:20 AM   Modules accepted: Orders

## 2011-09-24 ENCOUNTER — Other Ambulatory Visit: Payer: Self-pay | Admitting: Endocrinology

## 2011-09-24 DIAGNOSIS — E042 Nontoxic multinodular goiter: Secondary | ICD-10-CM

## 2011-09-29 ENCOUNTER — Ambulatory Visit (HOSPITAL_COMMUNITY): Admission: RE | Admit: 2011-09-29 | Payer: BC Managed Care – PPO | Source: Ambulatory Visit

## 2011-10-01 ENCOUNTER — Encounter (HOSPITAL_COMMUNITY)
Admission: RE | Admit: 2011-10-01 | Discharge: 2011-10-01 | Disposition: A | Discharge status: Home / Self Care | Payer: BC Managed Care – PPO | Source: Ambulatory Visit | Attending: Endocrinology | Admitting: Endocrinology

## 2011-10-01 DIAGNOSIS — E042 Nontoxic multinodular goiter: Secondary | ICD-10-CM | POA: Insufficient documentation

## 2011-10-01 MED ORDER — SODIUM PERTECHNETATE TC 99M INJECTION
10.0000 | Freq: Once | INTRAVENOUS | Status: AC | PRN
  Administered 2011-10-01: 10 via INTRAVENOUS

## 2012-10-14 ENCOUNTER — Telehealth: Payer: Self-pay | Admitting: *Deleted

## 2012-10-14 DIAGNOSIS — Z1239 Encounter for other screening for malignant neoplasm of breast: Secondary | ICD-10-CM

## 2012-10-14 NOTE — Telephone Encounter (Signed)
order done as mammo

## 2012-10-14 NOTE — Telephone Encounter (Signed)
Pt called requesting mammogram referral.  Pt states she did not make the mammogram appoint last year.  Please advise

## 2012-10-15 NOTE — Telephone Encounter (Signed)
Called pt no answer LMOM with md response.../lmb 

## 2012-10-17 ENCOUNTER — Other Ambulatory Visit: Payer: Self-pay | Admitting: *Deleted

## 2012-10-17 ENCOUNTER — Other Ambulatory Visit (INDEPENDENT_AMBULATORY_CARE_PROVIDER_SITE_OTHER): Payer: BC Managed Care – PPO

## 2012-10-17 DIAGNOSIS — E0590 Thyrotoxicosis, unspecified without thyrotoxic crisis or storm: Secondary | ICD-10-CM

## 2012-10-17 LAB — TSH: TSH: 0.54 u[IU]/mL (ref 0.35–5.50)

## 2012-10-17 LAB — T4, FREE: Free T4: 0.86 ng/dL (ref 0.60–1.60)

## 2012-10-20 ENCOUNTER — Encounter: Payer: Self-pay | Admitting: Endocrinology

## 2012-10-20 ENCOUNTER — Other Ambulatory Visit: Payer: Self-pay | Admitting: Internal Medicine

## 2012-10-20 ENCOUNTER — Ambulatory Visit (INDEPENDENT_AMBULATORY_CARE_PROVIDER_SITE_OTHER): Payer: BC Managed Care – PPO | Admitting: Endocrinology

## 2012-10-20 VITALS — BP 132/84 | HR 72 | Temp 98.5°F | Resp 12 | Ht 65.5 in | Wt 167.2 lb

## 2012-10-20 DIAGNOSIS — E049 Nontoxic goiter, unspecified: Secondary | ICD-10-CM

## 2012-10-20 NOTE — Progress Notes (Signed)
Patient ID: Jennifer Allen, female   DOB: [birth year], 47 y.o.   MRN: [id number]    Reason for Appointment: Followup of multinodular goiter   History of Present Illness:   The patient's thyroid enlargement was first discovered probably in 2011 At that time she had a low TSH on routine screening. Also had an upper normal free T4 level of 1.6 She was treated at that time for 2-3 months with methimazole. She stopped this because it caused a 10 pound weight gain Subsequently her TSH continued to remain relatively low at 0.02 and 0.03. When seen for evaluation in 9/13 she was found to have a multinodular goiter, larger on the right measuring 2-2.5 times normal She was asymptomatic at that time and TSH was 0.18 She was asked to followup in 6 months but did not do so.  She has had no difficulty with swallowing  Does not have any choking sensation in her neck or pressure in any position or when lying down. No symptoms of palpitations, shakiness, weight loss or heat intolerance  Lab Results  Component Value Date   FREET4 0.86 10/17/2012   FREET4 0.84 08/04/2011   FREET4 0.77 02/27/2010   FREET4 0.89 01/09/2010   TSH 0.54 10/17/2012   TSH 0.18* 08/04/2011   TSH 0.03* 02/27/2010   TSH 0.02* 01/09/2010    She has had ultrasound exam in 8/13 which showed multiple nodules ranging from 1-1.7 cm in size and the largest one being on the right. She has 3 nodules on the right and 2 on the left. Thyroid scan showed slightly warm area on the right, no cold nodule seen     Medication List       This list is accurate as of: 10/20/12  4:09 PM.  Always use your most recent med list.               FLUoxetine 20 MG tablet  Commonly known as:  PROZAC  TAKE 1 TABLET DAILY        Allergies:  Allergies  Allergen Reactions  . Sulfonamide Derivatives     REACTION: family hx of severe allergy    Past Medical History  Diagnosis Date  . HYPERTHYROIDISM 10/28/2009    incidental CPX dx  . POSTPARTUM  DEPRESSION 2008  . Allergic rhinitis, cause unspecified     Past Surgical History  Procedure Laterality Date  . Appendectomy  1995  . Tonsillectomy  1984  . Cesarean section  2008    Family History  Problem Relation Age of Onset  . Arthritis Mother   . Hypothyroidism Mother   . Fibromyalgia Mother   . Arthritis Father   . Alcohol abuse Other   . Arthritis Other   . Prostate cancer Other     Social History:  reports that she has never smoked. She does not have any smokeless tobacco history on file. She reports that she does not drink alcohol or use illicit drugs.   Review of Systems:  CARDIOLOGY: no history of high blood pressure.            ENDOCRINOLOGY:  no history of Diabetes.             Examination:   BP 132/84  Pulse 72  Temp(Src) 98.5 F (36.9 C)  Resp 12  Ht 5' 5.5" (1.664 m)  Wt 167 lb 3.2 oz (75.841 kg)  BMI 27.39 kg/m2  SpO2 98%  LMP 09/19/2012   General Appearance: pleasant,  Eyes: No abnormal prominence or eyelid swelling.          Neck: The thyroid is enlarged 2-2.5 times normal on the right and 1-1/2 times normal on the left. Thyroid feels firm and nodular There is no stridor. Pemberton sign is negative There is no lymphadenopathy .    Neurological: REFLEXES: at biceps are normal.  No ankle edema      Assessment/Plan:  Multinodular goiter, now euthyroid. Clinically the nodular goiter feels about the same as last year Her TSH is back to normal Since she appears to have a stable nodular goiter clinically and has no local symptoms she can continue to follow periodically with her PCP and have annual thyroid functions checked She will followup if thyroid functions are abnormal again  [facility] 10/05/2012, 11:25 AM

## 2012-11-09 ENCOUNTER — Ambulatory Visit
Admission: RE | Admit: 2012-11-09 | Discharge: 2012-11-09 | Disposition: A | Discharge status: Home / Self Care | Payer: BC Managed Care – PPO | Source: Ambulatory Visit | Attending: Internal Medicine | Admitting: Internal Medicine

## 2012-11-09 DIAGNOSIS — Z1239 Encounter for other screening for malignant neoplasm of breast: Secondary | ICD-10-CM

## 2012-11-10 ENCOUNTER — Other Ambulatory Visit: Payer: Self-pay

## 2013-01-11 ENCOUNTER — Ambulatory Visit (INDEPENDENT_AMBULATORY_CARE_PROVIDER_SITE_OTHER): Payer: BC Managed Care – PPO | Admitting: Internal Medicine

## 2013-01-11 ENCOUNTER — Encounter: Payer: Self-pay | Admitting: Internal Medicine

## 2013-01-11 VITALS — BP 108/68 | HR 68 | Temp 98.0°F | Ht 65.5 in | Wt 168.2 lb

## 2013-01-11 DIAGNOSIS — Z0000 Encounter for general adult medical examination without abnormal findings: Secondary | ICD-10-CM

## 2013-01-11 MED ORDER — FLUOXETINE HCL 20 MG PO TABS: 20.0000 mg | ORAL_TABLET | Freq: Every day | ORAL | Status: DC

## 2013-01-11 NOTE — Progress Notes (Signed)
Pre-visit discussion using our clinic review tool. No additional management support is needed unless otherwise documented below in the visit note.  

## 2013-01-11 NOTE — Patient Instructions (Addendum)
It was good to see you today.  We have reviewed your prior records including labs and tests today  Health Maintenance reviewed - all recommended immunizations and age-appropriate screenings are up-to-date.  Test(s) ordered today. Your results will be released to [facility] (or called to you) after review, usually within 72hours after test completion. If any changes need to be made, you will be notified at that same time.  Medications reviewed and updated, no changes recommended at this time.  Please schedule followup in 12 months for annual exam and labs, call sooner if problems.  Health Maintenance, Female A healthy lifestyle and preventative care can promote health and wellness.  Maintain regular health, dental, and eye exams.  Eat a healthy diet. Foods like vegetables, fruits, whole grains, low-fat dairy products, and lean protein foods contain the nutrients you need without too many calories. Decrease your intake of foods high in solid fats, added sugars, and salt. Get information about a proper diet from your caregiver, if necessary.  Regular physical exercise is one of the most important things you can do for your health. Most adults should get at least 150 minutes of moderate-intensity exercise (any activity that increases your heart rate and causes you to sweat) each week. In addition, most adults need muscle-strengthening exercises on 2 or more days a week.   Maintain a healthy weight. The body mass index (BMI) is a screening tool to identify possible weight problems. It provides an estimate of body fat based on height and weight. Your caregiver can help determine your BMI, and can help you achieve or maintain a healthy weight. For adults 20 years and older:  A BMI below 18.5 is considered underweight.  A BMI of 18.5 to 24.9 is normal.  A BMI of 25 to 29.9 is considered overweight.  A BMI of 30 and above is considered obese.  Maintain normal blood lipids and cholesterol by  exercising and minimizing your intake of saturated fat. Eat a balanced diet with plenty of fruits and vegetables. Blood tests for lipids and cholesterol should begin at age 96 and be repeated every 5 years. If your lipid or cholesterol levels are high, you are over 50, or you are a high risk for heart disease, you may need your cholesterol levels checked more frequently.Ongoing high lipid and cholesterol levels should be treated with medicines if diet and exercise are not effective.  If you smoke, find out from your caregiver how to quit. If you do not use tobacco, do not start.  Lung cancer screening is recommended for adults aged 32 80 years who are at high risk for developing lung cancer because of a history of smoking. Yearly low-dose computed tomography (CT) is recommended for people who have at least a 30-pack-year history of smoking and are a current smoker or have quit within the past 15 years. A pack year of smoking is smoking an average of 1 pack of cigarettes a day for 1 year (for example: 1 pack a day for 30 years or 2 packs a day for 15 years). Yearly screening should continue until the smoker has stopped smoking for at least 15 years. Yearly screening should also be stopped for people who develop a health problem that would prevent them from having lung cancer treatment.  If you are pregnant, do not drink alcohol. If you are breastfeeding, be very cautious about drinking alcohol. If you are not pregnant and choose to drink alcohol, do not exceed 1 drink per day. One drink  is considered to be 12 ounces (355 mL) of beer, 5 ounces (148 mL) of wine, or 1.5 ounces (44 mL) of liquor.  Avoid use of street drugs. Do not share needles with anyone. Ask for help if you need support or instructions about stopping the use of drugs.  High blood pressure causes heart disease and increases the risk of stroke. Blood pressure should be checked at least every 1 to 2 years. Ongoing high blood pressure should be  treated with medicines, if weight loss and exercise are not effective.  If you are 39 to 46 years old, ask your caregiver if you should take aspirin to prevent strokes.  Diabetes screening involves taking a blood sample to check your fasting blood sugar level. This should be done once every 3 years, after age 66, if you are within normal weight and without risk factors for diabetes. Testing should be considered at a younger age or be carried out more frequently if you are overweight and have at least 1 risk factor for diabetes.  Breast cancer screening is essential preventative care for women. You should practice "breast self-awareness." This means understanding the normal appearance and feel of your breasts and may include breast self-examination. Any changes detected, no matter how small, should be reported to a caregiver. Women in their 73s and 30s should have a clinical breast exam (CBE) by a caregiver as part of a regular health exam every 1 to 3 years. After age 39, women should have a CBE every year. Starting at age 91, women should consider having a mammogram (breast X-ray) every year. Women who have a family history of breast cancer should talk to their caregiver about genetic screening. Women at a high risk of breast cancer should talk to their caregiver about having an MRI and a mammogram every year.  Breast cancer gene (BRCA)-related cancer risk assessment is recommended for women who have family members with BRCA-related cancers. BRCA-related cancers include breast, ovarian, tubal, and peritoneal cancers. Having family members with these cancers may be associated with an increased risk for harmful changes (mutations) in the breast cancer genes BRCA1 and BRCA2. Results of the assessment will determine the need for genetic counseling and BRCA1 and BRCA2 testing.  The Pap test is a screening test for cervical cancer. Women should have a Pap test starting at age 63. Between ages 85 and 56, Pap  tests should be repeated every 2 years. Beginning at age 29, you should have a Pap test every 3 years as long as the past 3 Pap tests have been normal. If you had a hysterectomy for a problem that was not cancer or a condition that could lead to cancer, then you no longer need Pap tests. If you are between ages 49 and 47, and you have had normal Pap tests going back 10 years, you no longer need Pap tests. If you have had past treatment for cervical cancer or a condition that could lead to cancer, you need Pap tests and screening for cancer for at least 20 years after your treatment. If Pap tests have been discontinued, risk factors (such as a new sexual partner) need to be reassessed to determine if screening should be resumed. Some women have medical problems that increase the chance of getting cervical cancer. In these cases, your caregiver may recommend more frequent screening and Pap tests.  The human papillomavirus (HPV) test is an additional test that may be used for cervical cancer screening. The HPV test looks for  the virus that can cause the cell changes on the cervix. The cells collected during the Pap test can be tested for HPV. The HPV test could be used to screen women aged 31 years and older, and should be used in women of any age who have unclear Pap test results. After the age of 35, women should have HPV testing at the same frequency as a Pap test.  Colorectal cancer can be detected and often prevented. Most routine colorectal cancer screening begins at the age of 76 and continues through age 69. However, your caregiver may recommend screening at an earlier age if you have risk factors for colon cancer. On a yearly basis, your caregiver may provide home test kits to check for hidden blood in the stool. Use of a small camera at the end of a tube, to directly examine the colon (sigmoidoscopy or colonoscopy), can detect the earliest forms of colorectal cancer. Talk to your caregiver about this at  age 68, when routine screening begins. Direct examination of the colon should be repeated every 5 to 10 years through age 84, unless early forms of pre-cancerous polyps or small growths are found.  Hepatitis C blood testing is recommended for all people born from [birth year] through 1963 and any individual with known risks for hepatitis C.  Practice safe sex. Use condoms and avoid high-risk sexual practices to reduce the spread of sexually transmitted infections (STIs). Sexually active women aged 66 and younger should be checked for Chlamydia, which is a common sexually transmitted infection. Older women with new or multiple partners should also be tested for Chlamydia. Testing for other STIs is recommended if you are sexually active and at increased risk.  Osteoporosis is a disease in which the bones lose minerals and strength with aging. This can result in serious bone fractures. The risk of osteoporosis can be identified using a bone density scan. Women ages 65 and over and women at risk for fractures or osteoporosis should discuss screening with their caregivers. Ask your caregiver whether you should be taking a calcium supplement or vitamin D to reduce the rate of osteoporosis.  Menopause can be associated with physical symptoms and risks. Hormone replacement therapy is available to decrease symptoms and risks. You should talk to your caregiver about whether hormone replacement therapy is right for you.  Use sunscreen. Apply sunscreen liberally and repeatedly throughout the day. You should seek shade when your shadow is shorter than you. Protect yourself by wearing long sleeves, pants, a wide-brimmed hat, and sunglasses year round, whenever you are outdoors.  Notify your caregiver of new moles or changes in moles, especially if there is a change in shape or color. Also notify your caregiver if a mole is larger than the size of a pencil eraser.  Stay current with your immunizations. Document Released:  07/07/2010 Document Revised: 04/18/2012 Document Reviewed: 07/07/2010 [facility] Patient Information 2014 [place].

## 2013-01-11 NOTE — Progress Notes (Signed)
Subjective:    Patient ID: Jennifer Allen, female    DOB: 07-27-1965, 48 y.o.   MRN: [id number]  HPI  patient is here today for annual physical. Patient feels well and has no complaints.  Past Medical History  Diagnosis Date  . HYPERTHYROIDISM 10/28/2009    incidental CPX dx  . POSTPARTUM DEPRESSION 2008  . Allergic rhinitis, cause unspecified    Family History  Problem Relation Age of Onset  . Arthritis Mother   . Hypothyroidism Mother   . Fibromyalgia Mother   . Arthritis Father   . Alcohol abuse Other   . Arthritis Other   . Prostate cancer Other    History  Substance Use Topics  . Smoking status: Never Smoker   . 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 Alcohol Use: Yes     Review of Systems  Constitutional: Negative for fatigue and unexpected weight change.  Respiratory: Negative for cough, shortness of breath and wheezing.   Cardiovascular: Negative for chest pain, palpitations and leg swelling.  Gastrointestinal: Negative for nausea, abdominal pain and diarrhea.  Neurological: Negative for dizziness, weakness, light-headedness and headaches.  Psychiatric/Behavioral: Negative for dysphoric mood. The patient is not nervous/anxious.   All other systems reviewed and are negative.        Objective:   Physical Exam BP 108/68  Pulse 68  Temp(Src) 98 F (36.7 C) (Oral)  Ht 5' 5.5" (1.664 m)  Wt 168 lb 4 oz (76.318 kg)  BMI 27.56 kg/m2  LMP 12/05/2012 Wt Readings from Last 3 Encounters:  01/11/13 168 lb 4 oz (76.318 kg)  10/20/12 167 lb 3.2 oz (75.841 kg)  08/06/11 166 lb 2 oz (75.354 kg)   Constitutional: She is overweight, but appears well-developed and well-nourished. No distress.  HENT: Head: Normocephalic and atraumatic. Ears: B TMs ok, no erythema or effusion; Nose: Nose normal. Mouth/Throat: Oropharynx is clear and moist. No oropharyngeal exudate.  Eyes: Conjunctivae and EOM are normal.  Pupils are equal, round, and reactive to light. No scleral icterus.  Neck: Normal range of motion. Neck supple. No JVD present. Min goiter R>L, nontender.  Cardiovascular: Normal rate, regular rhythm and normal heart sounds.  No murmur heard. No BLE edema. Pulmonary/Chest: Effort normal and breath sounds normal. No respiratory distress. She has no wheezes.  Abdominal: Soft. Bowel sounds are normal. She exhibits no distension. There is no tenderness. no masses GU: defer to gyn Musculoskeletal: Normal range of motion, no joint effusions. No gross deformities Neurological: She is alert and oriented to person, place, and time. No cranial nerve deficit. Coordination normal.  Skin: Skin is warm and dry. No rash noted. No erythema.  Psychiatric: She has a normal mood and affect. Her behavior is normal. Judgment and thought content normal.   Lab Results  Component Value Date   WBC 7.4 08/04/2011   HGB 12.9 08/04/2011   HCT 39.0 08/04/2011   PLT 331.0 08/04/2011   GLUCOSE 87 08/04/2011   CHOL 186 08/04/2011   TRIG 153.0* 08/04/2011   HDL 71.30 08/04/2011   LDLCALC 84 08/04/2011   ALT 15 08/04/2011   AST 12 08/04/2011   NA 139 08/04/2011   K 4.2 08/04/2011   CL 101 08/04/2011   CREATININE 0.8 08/04/2011   BUN 18 08/04/2011   CO2 29 08/04/2011   TSH 0.54 10/17/2012        Assessment & Plan:  CPX/v70.0 - Patient has been counseled  on age-appropriate routine health concerns for screening and prevention. These are reviewed and up-to-date. Immunizations are up-to-date or declined. Labs reviewed.

## 2013-01-27 ENCOUNTER — Other Ambulatory Visit (INDEPENDENT_AMBULATORY_CARE_PROVIDER_SITE_OTHER): Payer: BC Managed Care – PPO

## 2013-01-27 DIAGNOSIS — Z0000 Encounter for general adult medical examination without abnormal findings: Secondary | ICD-10-CM

## 2013-01-27 LAB — URINALYSIS, ROUTINE W REFLEX MICROSCOPIC
Bilirubin Urine: NEGATIVE
Hgb urine dipstick: NEGATIVE
Ketones, ur: NEGATIVE
Leukocytes, UA: NEGATIVE
NITRITE: NEGATIVE
RBC / HPF: NONE SEEN (ref 0–?)
Specific Gravity, Urine: 1.01 (ref 1.000–1.030)
TOTAL PROTEIN, URINE-UPE24: NEGATIVE
URINE GLUCOSE: NEGATIVE
UROBILINOGEN UA: 0.2 (ref 0.0–1.0)
pH: 8 (ref 5.0–8.0)

## 2013-01-27 LAB — CBC WITH DIFFERENTIAL/PLATELET
BASOS PCT: 0.5 % (ref 0.0–3.0)
Basophils Absolute: 0 10*3/uL (ref 0.0–0.1)
EOS ABS: 0.2 10*3/uL (ref 0.0–0.7)
Eosinophils Relative: 2.9 % (ref 0.0–5.0)
HCT: 38.9 % (ref 36.0–46.0)
Hemoglobin: 13.2 g/dL (ref 12.0–15.0)
Lymphocytes Relative: 33.1 % (ref 12.0–46.0)
Lymphs Abs: 2 10*3/uL (ref 0.7–4.0)
MCHC: 33.9 g/dL (ref 30.0–36.0)
MCV: 90.9 fl (ref 78.0–100.0)
Monocytes Absolute: 0.5 10*3/uL (ref 0.1–1.0)
Monocytes Relative: 8.7 % (ref 3.0–12.0)
Neutro Abs: 3.4 10*3/uL (ref 1.4–7.7)
Neutrophils Relative %: 54.8 % (ref 43.0–77.0)
Platelets: 343 10*3/uL (ref 150.0–400.0)
RBC: 4.28 Mil/uL (ref 3.87–5.11)
RDW: 13 % (ref 11.5–14.6)
WBC: 6.2 10*3/uL (ref 4.5–10.5)

## 2013-01-27 LAB — HEPATIC FUNCTION PANEL
ALBUMIN: 3.9 g/dL (ref 3.5–5.2)
ALK PHOS: 51 U/L (ref 39–117)
ALT: 16 U/L (ref 0–35)
AST: 16 U/L (ref 0–37)
Bilirubin, Direct: 0.1 mg/dL (ref 0.0–0.3)
TOTAL PROTEIN: 7.2 g/dL (ref 6.0–8.3)
Total Bilirubin: 1 mg/dL (ref 0.3–1.2)

## 2013-01-27 LAB — LIPID PANEL
CHOLESTEROL: 173 mg/dL (ref 0–200)
HDL: 89.8 mg/dL (ref >39.00)
LDL CALC: 72 mg/dL (ref 0–99)
TRIGLYCERIDES: 57 mg/dL (ref 0.0–149.0)
Total CHOL/HDL Ratio: 2
VLDL: 11.4 mg/dL (ref 0.0–40.0)

## 2013-01-27 LAB — BASIC METABOLIC PANEL
BUN: 14 mg/dL (ref 6–23)
CO2: 31 mEq/L (ref 19–32)
CREATININE: 0.8 mg/dL (ref 0.4–1.2)
Calcium: 9.2 mg/dL (ref 8.4–10.5)
Chloride: 104 mEq/L (ref 96–112)
GFR: 88.5 mL/min (ref >60.00)
Glucose, Bld: 74 mg/dL (ref 70–99)
Potassium: 4 mEq/L (ref 3.5–5.1)
Sodium: 140 mEq/L (ref 135–145)

## 2013-01-27 LAB — TSH: TSH: 1.44 u[IU]/mL (ref 0.35–5.50)

## 2013-01-30 ENCOUNTER — Encounter: Payer: Self-pay | Admitting: Internal Medicine

## 2013-04-18 ENCOUNTER — Telehealth: Payer: Self-pay | Admitting: Internal Medicine

## 2013-04-18 NOTE — Telephone Encounter (Signed)
Patient Information:  Caller Name: Clayborne Danaatti  Phone: [phone]  Patient: Jennifer Allen, Jennifer Allen  Gender: Female  DOB: 1965-05-10  Age: 48 Years  PCP: Rene PaciLeschber, Valerie (Adults only)  Pregnant: No  Office Follow Up:  Does the office need to follow up with this patient?: Yes  Instructions For The Office: Appt time booked for today and tomorrow 4/14 and 4/15.  Please contact patient to schedule appt time for evaluation.  RN Note:  Appt time booked for today and tomorrow 4/14 and 4/15.  Please contact patient to schedule appt time for evaluation.  Symptoms  Reason For Call & Symptoms: She states she was getting out of the back seat of a car and used her left elbow back behind her to push herself forward. on 04/11/13.  She states pain immediately in the left elbow.  Bruising noted the next day 04/12/13  at the elbow  along with minor swelling.  She states it is still sore to the touch, pain with use and some ROM  Reviewed Health History In EMR: Yes  Reviewed Medications In EMR: Yes  Reviewed Allergies In EMR: Yes  Reviewed Surgeries / Procedures: Yes  Date of Onset of Symptoms: 04/11/2013  Treatments Tried: Ice  Treatments Tried Worked: Yes OB / GYN:  LMP: 04/15/2013  Guideline(s) Used:  Arm Injury  Elbow Injury  Disposition Per Guideline:   See Within 3 Days in Office  Reason For Disposition Reached:   Injury and pain has not improved after 3 days  Advice Given:  Apply Heat to the Area:  Beginning 48 hours after an injury, apply a warm washcloth or heating pad for 10 minutes three times a day.  This will help increase blood flow and improve healing.  Elevate the Arm:  Lay down and put your arm on a pillow. This puts (elevates) the elbow above the heart.  Do this for 15-20 minutes, 2-3 times a day, for the first two days.  This can also help decrease swelling, bruising, and pain.  Rest vs. Movement:  Movement is generally more healing in the long term than rest.  Avoid heavy lifting and  active sports for 1-2 weeks or until the pain and swelling are gone.  Call Back If:  Pain becomes severe  You become worse.  Pain Medicines:  For pain relief, you can take either acetaminophen, ibuprofen, or naproxen.  They are over-the-counter (OTC) pain drugs. You can buy them at the drugstore.  Ibuprofen (e.g., Motrin, Advil):  Another choice is to take 600 mg (three 200 mg pills) by mouth every 8 hours.  RN Overrode Recommendation:  Make Appointment  Appt time booked for today and tomorrow 4/14 and 4/15.  Please contact patient to schedule appt time for evaluation.

## 2013-04-21 ENCOUNTER — Encounter: Payer: Self-pay | Admitting: Internal Medicine

## 2013-04-21 ENCOUNTER — Ambulatory Visit (INDEPENDENT_AMBULATORY_CARE_PROVIDER_SITE_OTHER): Payer: BC Managed Care – PPO | Admitting: Internal Medicine

## 2013-04-21 VITALS — BP 110/80 | HR 79 | Temp 98.5°F | Wt 174.4 lb

## 2013-04-21 DIAGNOSIS — M778 Other enthesopathies, not elsewhere classified: Secondary | ICD-10-CM

## 2013-04-21 DIAGNOSIS — M6580 Other synovitis and tenosynovitis, unspecified site: Secondary | ICD-10-CM

## 2013-04-21 MED ORDER — NAPROXEN SODIUM 220 MG PO TABS: 220.0000 mg | ORAL_TABLET | Freq: Two times a day (BID) | ORAL | Status: DC

## 2013-04-21 NOTE — Progress Notes (Signed)
Pre visit review using our clinic review tool, if applicable. No additional management support is needed unless otherwise documented below in the visit note. 

## 2013-04-21 NOTE — Patient Instructions (Addendum)
It was good to see you today.  We have reviewed your prior records including labs and tests today  Medications reviewed and updated Use over-the-counter Aleve twice a day for one week, then as needed for pain No Prescription changes recommended at this time.  Use heat or ice to elbow twice daily for one week, 10 minutes per episode, then as needed  Please keep followup as scheduled, call sooner if problems.   Tendinitis Tendinitis is swelling and inflammation of the tendons. Tendons are band-like tissues that connect muscle to bone. Tendinitis commonly occurs in the:   Shoulders (rotator cuff).  Heels (Achilles tendon).  Elbows (triceps tendon). CAUSES Tendinitis is usually caused by overusing the tendon, muscles, and joints involved. When the tissue surrounding a tendon (synovium) becomes inflamed, it is called tenosynovitis. Tendinitis commonly develops in people whose jobs require repetitive motions. SYMPTOMS  Pain.  Tenderness.  Mild swelling. DIAGNOSIS Tendinitis is usually diagnosed by physical exam. Your caregiver may also order X-rays or other imaging tests. TREATMENT Your caregiver may recommend certain medicines or exercises for your treatment. HOME CARE INSTRUCTIONS   Use a sling or splint for as long as directed by your caregiver until the pain decreases.  Put ice on the injured area.  Put ice in a plastic bag.  Place a towel between your skin and the bag.  Leave the ice on for 15-20 minutes, 03-04 times a day.  Avoid using the limb while the tendon is painful. Perform gentle range of motion exercises only as directed by your caregiver. Stop exercises if pain or discomfort increase, unless directed otherwise by your caregiver.  Only take over-the-counter or prescription medicines for pain, discomfort, or fever as directed by your caregiver. SEEK MEDICAL CARE IF:   Your pain and swelling increase.  You develop new, unexplained symptoms, especially  increased numbness in the hands. MAKE SURE YOU:   Understand these instructions.  Will watch your condition.  Will get help right away if you are not doing well or get worse. Document Released: 12/20/1999 Document Revised: 03/16/2011 Document Reviewed: 03/10/2010 [facility] Patient Information 2014 [place], [place].

## 2013-04-21 NOTE — Progress Notes (Signed)
Subjective:    Patient ID: Jennifer Allen, female    DOB: 1965/03/22, 48 y.o.   MRN: [id number]  Arm Injury  The incident occurred more than 1 week ago. The injury mechanism was a direct blow. The pain is present in the left elbow. The quality of the pain is described as stabbing and aching (worse with direct pressure). The pain is moderate. Pertinent negatives include no chest pain, muscle weakness, numbness or tingling. The symptoms are aggravated by palpation. She has tried ice for the symptoms. The treatment provided moderate relief.    Also reviewed chronic medical issues and interval medical events  Past Medical History  Diagnosis Date  . HYPERTHYROIDISM 10/28/2009    incidental CPX dx  . POSTPARTUM DEPRESSION 2008  . Allergic rhinitis, cause unspecified     Review of Systems  Cardiovascular: Negative for chest pain.  Neurological: Negative for tingling and numbness.       Objective:   Physical Exam  BP 110/80  Pulse 79  Temp(Src) 98.5 F (36.9 C) (Oral)  Wt 174 lb 6.4 oz (79.107 kg)  SpO2 97% Wt Readings from Last 3 Encounters:  04/21/13 174 lb 6.4 oz (79.107 kg)  01/11/13 168 lb 4 oz (76.318 kg)  10/20/12 167 lb 3.2 oz (75.841 kg)   Constitutional: She appears well-developed and well-nourished. No distress.  Neck: Normal range of motion. Neck supple. No JVD present. No thyromegaly present.  Cardiovascular: Normal rate, regular rhythm and normal heart sounds.  No murmur heard. No BLE edema. Pulmonary/Chest: Effort normal and breath sounds normal. No respiratory distress. She has no wheezes.  Mskel: left elbow without effusion or gross deformity. No bruise or erythema. Minimally tender over the triceps insertion. Olecranon intact without effusion or tenderness. Medial lateral epicondyle nontender to palpation. Full range of motion with good strength. Nonpainful supination and pronation. Ligamentous function intact. Neurovascularly intact Psychiatric: She has a  normal mood and affect. Her behavior is normal. Judgment and thought content normal.   Lab Results  Component Value Date   WBC 6.2 01/27/2013   HGB 13.2 01/27/2013   HCT 38.9 01/27/2013   PLT 343.0 01/27/2013   GLUCOSE 74 01/27/2013   CHOL 173 01/27/2013   TRIG 57.0 01/27/2013   HDL 89.80 01/27/2013   LDLCALC 72 01/27/2013   ALT 16 01/27/2013   AST 16 01/27/2013   NA 140 01/27/2013   K 4.0 01/27/2013   CL 104 01/27/2013   CREATININE 0.8 01/27/2013   BUN 14 01/27/2013   CO2 31 01/27/2013   TSH 1.44 01/27/2013    Mm Digital Screening  11/17/2012   CLINICAL DATA:  Screening. Family history of breast cancer in 2 cousins in their 850s  EXAM: DIGITAL SCREENING BILATERAL MAMMOGRAM WITH CAD  COMPARISON:  Previous exam(s).  ACR Breast Density Category c: The breasts are heterogeneously dense, which may obscure small masses.  FINDINGS: There are no findings suspicious for malignancy. Images were processed with CAD.  IMPRESSION: No mammographic evidence of malignancy. A result letter of this screening mammogram will be mailed directly to the patient.  RECOMMENDATION: Screening mammogram in one year. (Code:SM-B-01Y)  BI-RADS CATEGORY  1: Negative   Electronically Signed   By: Leda GauzeBecky  Kennedy M.D.   On: 11/17/2012 08:37       Assessment & Plan:   Left elbow tendinitis. Traumatic contusion from direct pressure 10 days ago. No evidence for fracture, tear weakness or other injury on exam. Advise continued symptomatic care with ice or heat  as preferred for symptom relief twice a day and over-the-counter NSAID Aleve twice daily x7 days, then as needed. Patient understands and agrees, patient will call if symptoms worse or unimproved in the next 2 weeks

## 2013-10-03 ENCOUNTER — Ambulatory Visit (INDEPENDENT_AMBULATORY_CARE_PROVIDER_SITE_OTHER): Payer: BC Managed Care – PPO | Admitting: Nurse Practitioner

## 2013-10-03 ENCOUNTER — Encounter: Payer: Self-pay | Admitting: Nurse Practitioner

## 2013-10-03 VITALS — BP 112/80 | HR 80 | Temp 98.1°F | Wt 171.8 lb

## 2013-10-03 DIAGNOSIS — M79609 Pain in unspecified limb: Secondary | ICD-10-CM

## 2013-10-03 DIAGNOSIS — L03032 Cellulitis of left toe: Secondary | ICD-10-CM

## 2013-10-03 DIAGNOSIS — M79675 Pain in left toe(s): Secondary | ICD-10-CM

## 2013-10-03 DIAGNOSIS — L03039 Cellulitis of unspecified toe: Secondary | ICD-10-CM

## 2013-10-03 MED ORDER — CEFUROXIME AXETIL 500 MG PO TABS: 500.0000 mg | ORAL_TABLET | Freq: Two times a day (BID) | ORAL | Status: DC

## 2013-10-03 NOTE — Patient Instructions (Signed)
Take antibiotic until complete Call clinic if symptoms worsen or not resolved   Paronychia Paronychia is an inflammatory reaction involving the folds of the skin surrounding the fingernail. This is commonly caused by an infection in the skin around a nail. The most common cause of paronychia is frequent wetting of the hands (as seen with bartenders, food servers, nurses or others who wet their hands). This makes the skin around the fingernail susceptible to infection by bacteria (germs) or fungus. Other predisposing factors are:  Aggressive manicuring.  Nail biting.  Thumb sucking. The most common cause is a staphylococcal (a type of germ) infection, or a fungal (Candida) infection. When caused by a germ, it usually comes on suddenly with redness, swelling, pus and is often painful. It may get under the nail and form an abscess (collection of pus), or form an abscess around the nail. If the nail itself is infected with a fungus, the treatment is usually prolonged and may require oral medicine for up to one year. Your caregiver will determine the length of time treatment is required. The paronychia caused by bacteria (germs) may largely be avoided by not pulling on hangnails or picking at cuticles. When the infection occurs at the tips of the finger it is called felon. When the cause of paronychia is from the herpes simplex virus (HSV) it is called herpetic whitlow. TREATMENT  When an abscess is present treatment is often incision and drainage. This means that the abscess must be cut open so the pus can get out. When this is done, the following home care instructions should be followed. HOME CARE INSTRUCTIONS   It is important to keep the affected fingers very dry. Rubber or plastic gloves over cotton gloves should be used whenever the hand must be placed in water.  Keep wound clean, dry and dressed as suggested by your caregiver between warm soaks or warm compresses.  Soak in warm water for  fifteen to twenty minutes three to four times per day for bacterial infections. Fungal infections are very difficult to treat, so often require treatment for long periods of time.  For bacterial (germ) infections take antibiotics (medicine which kill germs) as directed and finish the prescription, even if the problem appears to be solved before the medicine is gone.  Only take over-the-counter or prescription medicines for pain, discomfort, or fever as directed by your caregiver. SEEK IMMEDIATE MEDICAL CARE IF:  You have redness, swelling, or increasing pain in the wound.  You notice pus coming from the wound.  You have a fever.  You notice a bad smell coming from the wound or dressing. Document Released: 06/17/2000 Document Revised: 03/16/2011 Document Reviewed: 02/17/2008 [facility] Patient Information 2015 [employer], [place]. This information is not intended to replace advice given to you by your health care provider. Make sure you discuss any questions you have with your health care provider.

## 2013-10-03 NOTE — Progress Notes (Signed)
Pre visit review using our clinic review tool, if applicable. No additional management support is needed unless otherwise documented below in the visit note. 

## 2013-10-03 NOTE — Progress Notes (Signed)
Subjective:    Patient ID: Jennifer Allen, female    DOB: 11-Nov-1965, 48 y.o.   MRN: [id number]  HPI  Patient is seen for painful swollen Left great toe.  Patient reports injuring toe in May 2015.  Approx 3 weeks ago noticed distal joint of toe painful, loss of ROM, now becoming increasingly reddened, swollen and painful around nail bed.     Review of Systems  Constitutional: Negative.   Musculoskeletal:       See HPI  Skin:       Great toe cuticle and nail bed painful and swollen as reported by patient   Past Medical History  Diagnosis Date  . HYPERTHYROIDISM 10/28/2009    incidental CPX dx  . POSTPARTUM DEPRESSION 2008  . Allergic rhinitis, cause unspecified     History   Social History  . Marital Status: Married    Spouse Name: N/A    Number of Children: N/A  . Years of Education: N/A   Occupational History  . Not on file.   Social History Main Topics  . Smoking status: Never Smoker   . 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 Alcohol Use: Yes  . Drug Use: No  . Sexual Activity: Not on file   Other Topics Concern  . Not on file   Social History Narrative  . No narrative on file    Past Surgical History  Procedure Laterality Date  . Appendectomy  1995  . Tonsillectomy  1984  . Cesarean section  2008    Family History  Problem Relation Age of Onset  . Arthritis Mother   . Hypothyroidism Mother   . Fibromyalgia Mother   . Arthritis Father   . Alcohol abuse Other   . Arthritis Other   . Prostate cancer Other     Allergies  Allergen Reactions  . Sulfonamide Derivatives     REACTION: family hx of severe allergy    Current Outpatient Prescriptions on File Prior to Visit  Medication Sig Dispense Refill  . FLUoxetine (PROZAC) 20 MG tablet Take 1 tablet (20 mg total) by mouth daily.  90 tablet  3   No current facility-administered medications on file prior to visit.    BP 112/80   Pulse 80  Temp(Src) 98.1 F (36.7 C) (Oral)  Wt 171 lb 12 oz (77.905 kg)  SpO2 97%        Objective:   Physical Exam  Constitutional: She is oriented to person, place, and time. She appears well-developed and well-nourished. No distress.  HENT:  Head: Normocephalic.  Eyes: Pupils are equal, round, and reactive to light.  Musculoskeletal:  Great toe distal joint painful, limited ROM.  Neurological: She is alert and oriented to person, place, and time.  Skin: Skin is warm and dry. There is erythema.    Skin surrounding nail inflamed, erythematous with small amount of purulent non draining area.    Psychiatric: She has a normal mood and affect.          Assessment & Plan:  1. Pain of left great toe  - cefUROXime (CEFTIN) 500 MG tablet; Take 1 tablet (500 mg total) by mouth 2 (two) times daily with a meal.  Dispense: 14 tablet; Refill: 0 Patient declines xray of great toe.   2. Paronychia of great toe, left See above, warm compresses.  Call clinic with symptoms that worsen or not resolved Keep regular follow  up with your Primary Care Physician

## 2013-10-20 ENCOUNTER — Other Ambulatory Visit: Payer: Self-pay

## 2013-10-27 ENCOUNTER — Telehealth: Payer: Self-pay | Admitting: Internal Medicine

## 2013-10-27 NOTE — Telephone Encounter (Signed)
Patient states she was to call back in to notify if antibiotics were not helping infection in toe.  It has helped some but she still has infection.  Please advise.

## 2013-11-09 ENCOUNTER — Ambulatory Visit: Payer: BC Managed Care – PPO | Admitting: Family

## 2013-11-13 ENCOUNTER — Ambulatory Visit: Payer: BC Managed Care – PPO | Admitting: Family

## 2013-11-14 ENCOUNTER — Ambulatory Visit (INDEPENDENT_AMBULATORY_CARE_PROVIDER_SITE_OTHER): Payer: BC Managed Care – PPO | Admitting: Family

## 2013-11-14 ENCOUNTER — Encounter: Payer: Self-pay | Admitting: Family

## 2013-11-14 VITALS — BP 120/82 | HR 72 | Temp 98.1°F | Resp 18 | Ht 65.5 in | Wt 171.8 lb

## 2013-11-14 DIAGNOSIS — L03032 Cellulitis of left toe: Secondary | ICD-10-CM

## 2013-11-14 MED ORDER — CEPHALEXIN 500 MG PO CAPS: 500.0000 mg | ORAL_CAPSULE | Freq: Four times a day (QID) | ORAL | Status: DC

## 2013-11-14 NOTE — Patient Instructions (Signed)
Thank you for choosing [facility].  Summary/Instructions:   Please use the antibiotic as we discussed  Trial Epson salt soaks 2-3x per day as needed   May use triple antibiotic between soaks  If no improvement or worsens we will refer to podiatry for further management.

## 2013-11-14 NOTE — Progress Notes (Signed)
   Subjective:    Patient ID: Jennifer Prowsatricia K Sundby, female    DOB: 1965/03/22, 48 y.o.   MRN: [id number]  Chief Complaint  Patient presents with  . Follow-up    infected toe got better for a bit then started getting worse   HPI:  Jennifer Allen is a 48 y.o. female who presents today for follow up of a toe infection. Was seen on 9/29 and diagnosed with paronychia of the great toe. Was given a prescription for Ceftin. Pt called on 10/23 and indicated it had not improved.   Finished the antibiotic and it became slightly pink, and now having slightly increased pain and redness. Denies any fevers. Has tried essential oils.  Denies any pus or discharge since completing the antibiotic. There is nothing that make it better or worse.   Allergies  Allergen Reactions  . Sulfonamide Derivatives     REACTION: family hx of severe allergy   Current Outpatient Prescriptions on File Prior to Visit  Medication Sig Dispense Refill  . FLUoxetine (PROZAC) 20 MG tablet Take 1 tablet (20 mg total) by mouth daily. 90 tablet 3   No current facility-administered medications on file prior to visit.   Review of Systems    See HPI  Objective:    BP 120/82 mmHg  Pulse 72  Temp(Src) 98.1 F (36.7 C) (Oral)  Resp 18  Ht 5' 5.5" (1.664 m)  Wt 171 lb 12.8 oz (77.928 kg)  BMI 28.14 kg/m2  SpO2 97% Nursing note and vital signs reviewed.  Physical Exam  Constitutional: She is oriented to person, place, and time. She appears well-developed and well-nourished. No distress.  Cardiovascular: Normal rate, regular rhythm, normal heart sounds and intact distal pulses.   Pulmonary/Chest: Effort normal and breath sounds normal.  Neurological: She is alert and oriented to person, place, and time.  Skin: Skin is warm and dry.  Mild errythema of left great toe noted, mainly at the base of the nail. Mildly tender. No expressible pus or discharge. No evidence of systemic infection.   Psychiatric: She has a normal mood  and affect. Her behavior is normal. Judgment and thought content normal.       Assessment & Plan:

## 2013-11-14 NOTE — Assessment & Plan Note (Signed)
Continued some mild redness. Start Keflex. Soak in warm water and Epson salt 2-3x per day for about 20 min. Antibiotic cream as needed. Follow up if symptoms worsen or fail to improve. Will consider referral to podiatry.

## 2013-11-14 NOTE — Progress Notes (Signed)
Pre visit review using our clinic review tool, if applicable. No additional management support is needed unless otherwise documented below in the visit note. 

## 2013-11-23 ENCOUNTER — Telehealth: Payer: Self-pay | Admitting: Internal Medicine

## 2013-11-23 DIAGNOSIS — L089 Local infection of the skin and subcutaneous tissue, unspecified: Secondary | ICD-10-CM

## 2013-11-23 NOTE — Telephone Encounter (Signed)
Pt called stated that antibiotic that Tammy SoursGreg gave her for toe infection is not working. Pt would like to know what would be the next step, pt remember Tammy SoursGreg mention something about podiatrist referral? Please advise.

## 2013-11-23 NOTE — Telephone Encounter (Signed)
Podiatry referral has been sent.

## 2013-11-23 NOTE — Telephone Encounter (Signed)
Pt aware.

## 2013-12-07 ENCOUNTER — Ambulatory Visit (INDEPENDENT_AMBULATORY_CARE_PROVIDER_SITE_OTHER): Payer: BC Managed Care – PPO | Admitting: Podiatry

## 2013-12-07 VITALS — BP 124/74 | HR 83 | Resp 16 | Ht 65.5 in | Wt 170.0 lb

## 2013-12-07 DIAGNOSIS — B351 Tinea unguium: Secondary | ICD-10-CM

## 2013-12-07 MED ORDER — TERBINAFINE HCL 250 MG PO TABS: 250.0000 mg | ORAL_TABLET | Freq: Every day | ORAL | Status: DC

## 2013-12-07 NOTE — Patient Instructions (Signed)

## 2013-12-07 NOTE — Progress Notes (Signed)
   Subjective:    Patient ID: Jennifer Allen, female    DOB: 01-12-1965, 48 y.o.   MRN: [id number]  HPI Comments: The left great toenail having issues. This has been going on for almost 4 months, been on two rounds of antibiotics and soaking it daily in epsom salts. It is a little bit better      Review of Systems  All other systems reviewed and are negative.      Objective:   Physical Exam: I have reviewed her past medical history medications allergies surgery social history and review of systems pulses are strongly palpable left foot. Neurologic sensorium is intact per Semmes-Weinstein monofilament. Deep tendon reflexes are intact bilaterally muscle strength +5 over 5 dorsiflexion and plantar flexors and inverters and everters all intrinsic musculature is intact. Orthopedic evaluation demonstrates all joints distal to the ankle range of motion without crepitation. Cutaneous evaluation demonstrates supple well-hydrated cutis she does have erythema along the proximal nail fold of the hallux left which has been diagnosed as cellulitis and has had an abscess with purulence in the past. However after 2 rounds of antibiotics the erythema remains. Her nail does demonstrate a traumatic injury and the medialmost portion of the nail proximally does demonstrate what appears to be possibly a fungal infection.        Assessment & Plan:  Assessment: Paronychia proximal nailfold hallux left. His possibly a fungal infection.  Plan: I started her on Lamisil therapy 250 mg tablets 1 by mouth daily and she is to continue soaking in Epsom salts and warm water twice daily until I see her again. I will follow-up with her in 1 month

## 2014-01-09 ENCOUNTER — Ambulatory Visit: Payer: BC Managed Care – PPO | Admitting: Podiatry

## 2014-01-11 ENCOUNTER — Ambulatory Visit: Payer: Self-pay | Admitting: Podiatry

## 2014-01-23 ENCOUNTER — Ambulatory Visit (INDEPENDENT_AMBULATORY_CARE_PROVIDER_SITE_OTHER): Payer: BLUE CROSS/BLUE SHIELD | Admitting: Podiatry

## 2014-01-23 VITALS — BP 120/70 | HR 82 | Resp 16

## 2014-01-23 DIAGNOSIS — B351 Tinea unguium: Secondary | ICD-10-CM

## 2014-01-23 NOTE — Progress Notes (Signed)
She presents today for follow-up of a mycotic infection to the eponychial of the left hallux. She's been on Lamisil for month and states that she sees no improvement.  Objective: Vital signs are stable she is alert and oriented 3. Pulses remain palpable left foot. Onychomycosis appears to be resolving around the eponychial fold and the cuticle appears to be doing much better than previously noted.  Assessment: Resolving onychomycotic infection or tinea infection.  Plan: I encouraged her to take another round of Lamisil however she states that she was to try essential oils instead of Lamisil therapy and refuses the treatment. I will follow-up with her as needed

## 2014-02-13 ENCOUNTER — Other Ambulatory Visit: Payer: Self-pay | Admitting: Internal Medicine

## 2014-05-09 ENCOUNTER — Ambulatory Visit (INDEPENDENT_AMBULATORY_CARE_PROVIDER_SITE_OTHER): Payer: BLUE CROSS/BLUE SHIELD | Admitting: Internal Medicine

## 2014-05-09 ENCOUNTER — Encounter: Payer: Self-pay | Admitting: Internal Medicine

## 2014-05-09 ENCOUNTER — Telehealth: Payer: Self-pay | Admitting: Internal Medicine

## 2014-05-09 VITALS — BP 118/70 | HR 90 | Temp 99.5°F | Resp 18 | Ht 65.5 in | Wt 165.1 lb

## 2014-05-09 DIAGNOSIS — Z0000 Encounter for general adult medical examination without abnormal findings: Secondary | ICD-10-CM

## 2014-05-09 DIAGNOSIS — Z0189 Encounter for other specified special examinations: Secondary | ICD-10-CM

## 2014-05-09 DIAGNOSIS — R059 Cough, unspecified: Secondary | ICD-10-CM | POA: Insufficient documentation

## 2014-05-09 DIAGNOSIS — R05 Cough: Secondary | ICD-10-CM | POA: Diagnosis not present

## 2014-05-09 NOTE — Patient Instructions (Signed)
Please get lots of rest and more fluids  You can also take Delsym OTC for cough, and/or Mucinex (or it's generic off brand) for congestion, and tylenol as needed for pain  Please continue all other medications as before, and refills have been done if requested.  Please have the pharmacy call with any other refills you may need.  Please keep your appointments with your specialists as you may have planned  You will be contacted regarding the referral for: mammogram at your request

## 2014-05-09 NOTE — Assessment & Plan Note (Signed)
Likely viral illness related, exam benign, declines anti-emetic prn, for tylenol/rest/fluids,  to f/u any worsening symptoms or concerns

## 2014-05-09 NOTE — Progress Notes (Signed)
   Subjective:    Patient ID: Jennifer Allen, female    DOB: 1965/03/11, 49 y.o.   MRN: [id number]  HPI  Here with 6 days onset mild nonprod cough, HA, fatigue, generalized aches and pains with occas nausea, and mild to minor nonbloody diarrhea.  Pt denies chest pain, increased sob or doe, wheezing, orthopnea, PND, increased LE swelling, palpitations, dizziness or syncope. Denies worsening reflux, abd pain, dysphagia, n/v, bowel change or blood other than above.  Temp was 102 to start, low grade since then.  No sick contacts she is aware Denies urinary symptoms such as dysuria, frequency, urgency, flank pain, hematuria or n/v, fever, chills.  Pt asks for mamogram sched with last done nov 2014 Past Medical History  Diagnosis Date  . HYPERTHYROIDISM 10/28/2009    incidental CPX dx  . POSTPARTUM DEPRESSION 2008  . Allergic rhinitis, cause unspecified    Past Surgical History  Procedure Laterality Date  . Appendectomy  1995  . Tonsillectomy  1984  . Cesarean section  2008    reports that she has never smoked. She has never used smokeless tobacco. She reports that she drinks alcohol. She reports that she does not use illicit drugs. family history includes Alcohol abuse in her other; Arthritis in her father, mother, and other; Fibromyalgia in her mother; Hypothyroidism in her mother; Prostate cancer in her other. Allergies  Allergen Reactions  . Sulfonamide Derivatives     REACTION: family hx of severe allergy   No current outpatient prescriptions on file prior to visit.   No current facility-administered medications on file prior to visit.    Review of Systems All otherwise neg per pt     Objective:   Physical Exam BP 118/70 mmHg  Pulse 90  Temp(Src) 99.5 F (37.5 C) (Oral)  Resp 18  Ht 5' 5.5" (1.664 m)  Wt 165 lb 1.9 oz (74.898 kg)  BMI 27.05 kg/m2  SpO2 97% VS noted,  Fatigued appearing Constitutional: Pt appears in no significant distress HENT: Head: NCAT.  Right Ear:  External ear normal.  Left Ear: External ear normal.  + multinod goiter, nontender Eyes: . Pupils are equal, round, and reactive to light. Conjunctivae and EOM are normal Neck: Normal range of motion. Neck supple.  Cardiovascular: Normal rate and regular rhythm.   Pulmonary/Chest: Effort normal and breath sounds without rales or wheezing.  Abd:  Soft, NT, ND, + BS Neurological: Pt is alert. Not confused , motor grossly intact Skin: Skin is warm. No rash, no LE edema Psychiatric: Pt behavior is normal. No agitation.      Assessment & Plan:

## 2014-05-09 NOTE — Telephone Encounter (Signed)
Patient Name: Jennifer FishermanRICIA Allen  DOB: Aug 27, 1965    Initial Comment Caller states has been having headaches and low fevers. Also having body aches.    Nurse Assessment  Nurse: Sherilyn CooterHenry, RN, Thurmond ButtsWade Date/Time Lamount Cohen(Eastern Time): 05/09/2014 10:43:54 AM  Confirm and document reason for call. If symptomatic, describe symptoms. ---Caller states that she had an upset stomach on Friday. She has a cough, headache and fever which began Saturday. She had a fever again yesterday. Temp is 99.5 orally. She still has the headache. She rates the pain as 3 on 0-10 scale. She is unsure if she has been exposed to flu.  Has the patient traveled out of the country within the last 30 days? ---No  Does the patient require triage? ---Yes  Related visit to physician within the last 2 weeks? ---No  Does the PT have any chronic conditions? (i.e. diabetes, asthma, etc.) ---No  Did the patient indicate they were pregnant? ---No     Guidelines    Guideline Title Affirmed Question Affirmed Notes  Headache [1] MODERATE headache (e.g., interferes with normal activities) AND [2] present > 24 hours AND [3] unexplained (Exceptions: analgesics not tried, typical migraine, or headache part of viral illness)    Final Disposition User   See Physician within 24 Hours Sherilyn CooterHenry, RN, Thurmond ButtsWade    Comments  Appointment scheduled today with Dr. Oliver BarreJames John at 3:15pm.

## 2014-05-11 ENCOUNTER — Telehealth: Payer: Self-pay | Admitting: *Deleted

## 2014-05-11 NOTE — Telephone Encounter (Signed)
[facility] - Client TELEPHONE ADVICE RECORD [facility] Patient Name: Jennifer Allen Gender: Female DOB: [birth year] Age: [age] Y 24 D Return Phone Number: [phone] (Primary) Address: City/State/Zip: [place] Client [facility] - Client Client Site [facility] Day Physician Rene PaciLeschber, Valerie Contact Type Call Call Type Triage / Clinical Relationship To Patient Self Appointment Disposition EMR Appointment Scheduled Info pasted into Epic Yes Return Phone Number [phone] (Primary) Chief Complaint Headache Initial Comment Caller states has been having headaches and low fevers. Also having body aches. PreDisposition Call Doctor Nurse Assessment Nurse: Sherilyn CooterHenry, RN, Thurmond ButtsWade Date/Time Lamount Cohen(Eastern Time): 05/09/2014 10:43:54 AM Confirm and document reason for call. If symptomatic, describe symptoms. ---Caller states that she had an upset stomach on Friday. She has a cough, headache and fever which began Saturday. She had a fever again yesterday. Temp is 99.5 orally. She still has the headache. She rates the pain as 3 on 0-10 scale. She is unsure if she has been exposed to flu. Has the patient traveled out of the country within the last 30 days? ---No Does the patient require triage? ---Yes Related visit to physician within the last 2 weeks? ---No Does the PT have any chronic conditions? (i.e. diabetes, asthma, etc.) ---No Did the patient indicate they were pregnant? ---No Guidelines Guideline Title Affirmed Question Affirmed Notes Nurse Date/Time (Eastern Time) Headache [1] MODERATE headache (e.g., interferes with normal activities) AND [2] present > 24 hours AND [3] unexplained (Exceptions: analgesics not tried, typical migraine, or headache part of viral illness) Sherilyn CooterHenry, RN, Thurmond ButtsWade 05/09/2014 10:46:30 AM PLEASE NOTE: All timestamps contained within this report are represented as [place] Standard Time. CONFIDENTIALTY  NOTICE: This fax transmission is intended only for the addressee. It contains information that is legally privileged, confidential or otherwise protected from use or disclosure. If you are not the intended recipient, you are strictly prohibited from reviewing, disclosing, copying using or disseminating any of this information or taking any action in reliance on or regarding this information. If you have received this fax in error, please notify us immediately by telephone so that we can arrange for its return to us. Phone: [phone], Toll-Free: [phone], Fax: [phone] Page: 2 of 2 Call Id: [id number] Disp. Time Lamount Cohen(Eastern Time) Disposition Final User 05/09/2014 10:49:01 AM See Physician within 24 Hours Yes Sherilyn CooterHenry, RN, Leory PlowmanWade Caller Understands: Yes Disagree/Comply: Comply Care Advice Given Per Guideline SEE PHYSICIAN WITHIN 24 HOURS: ACETAMINOPHEN (E.G., TYLENOL): IBUPROFEN (E.G., MOTRIN, ADVIL): * Do not take nonsteroidal anti-inflammatory drugs (NSAIDs) if you have stomach problems, kidney disease, heart failure, or other contraindications to using this type of medication. * Do not take NSAID medications for over 7 days without consulting your PCP. * GASTROINTESTINAL RISK: There is an increased risk of stomach ulcers, GI bleeding, perforation. * CARDIOVASCULAR RISK: There may be an increased risk of heart attack and stroke. LOCAL COLD: Apply a cold wet washcloth or cold pack to the forehead for 20 minutes STRETCHING: Stretch and massage any tight neck muscles. * You become worse. After Care Instructions Given Call Event Type User Date / Time Description Comments User: Ronney AstersWade, Henry, RN Date/Time Lamount Cohen(Eastern Time): 05/09/2014 10:54:00 AM Appointment scheduled today with Dr. Oliver BarreJames John at 3:15pm. Referrals REFERRED TO PCP OFFICE

## 2014-07-26 IMAGING — US US SOFT TISSUE HEAD/NECK
1 series · 14 of 25 positions shown · non-contrast
Comparison: None.

CLINICAL DATA: Follow up thyroid goiter

THYROID ULTRASOUND
TECHNIQUE: Ultrasound examination of the thyroid gland and adjacent
soft tissues was performed.

[Series 1: us soft tissue head/neck · 0.07mm/px · 14 of 52 slices shown]
[im 1/52]
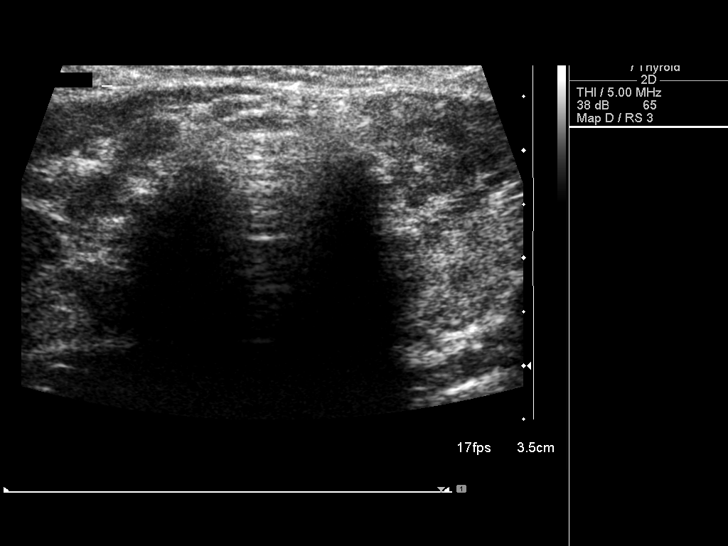
[im 5/52]
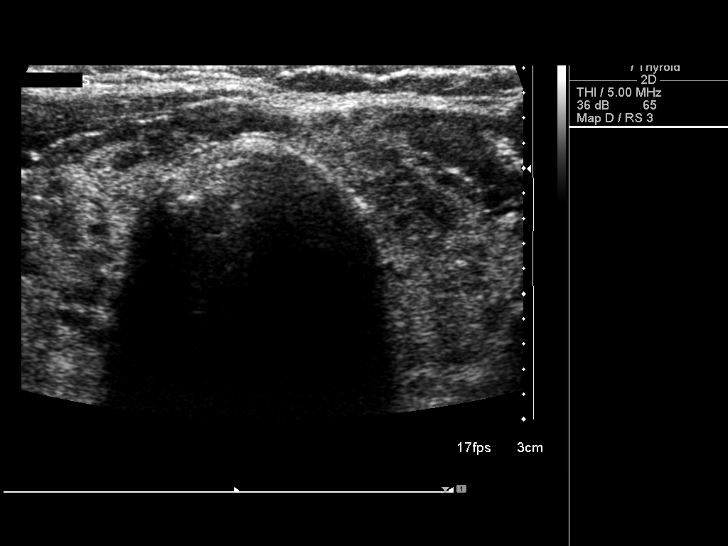
[im 9/52]
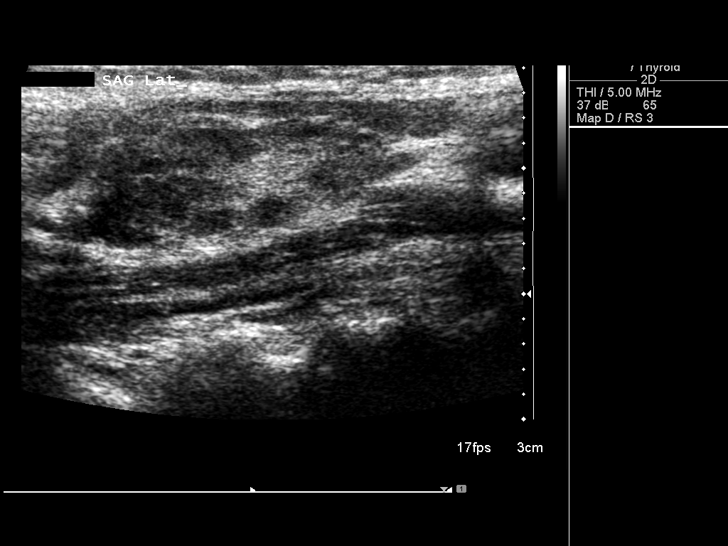
[im 13/52]
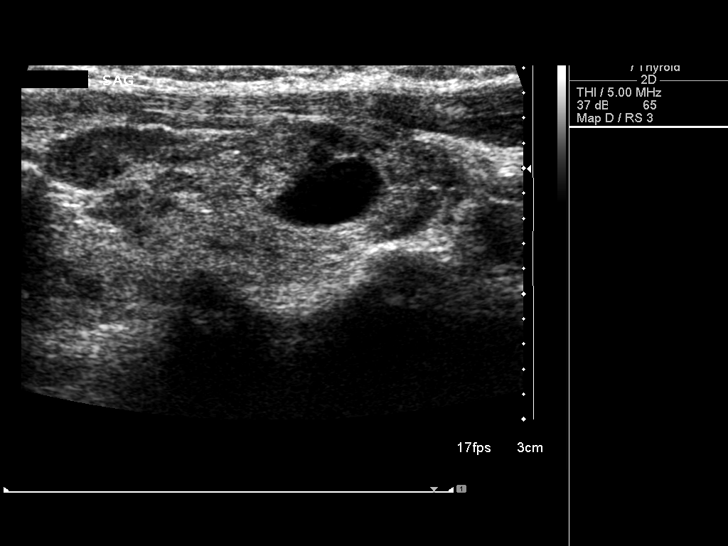
[im 18/52]
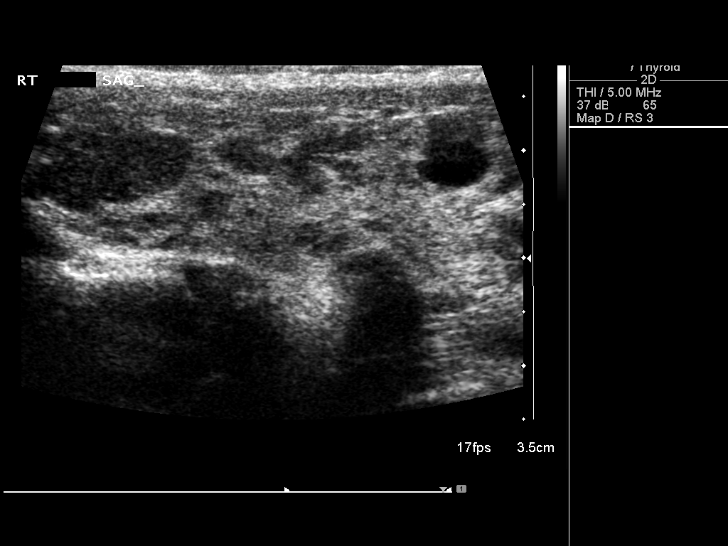
[im 20/52]
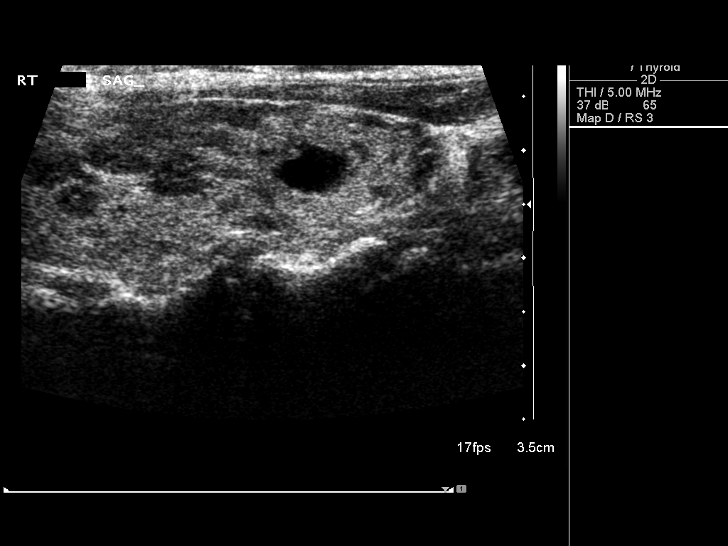
[im 24/52]
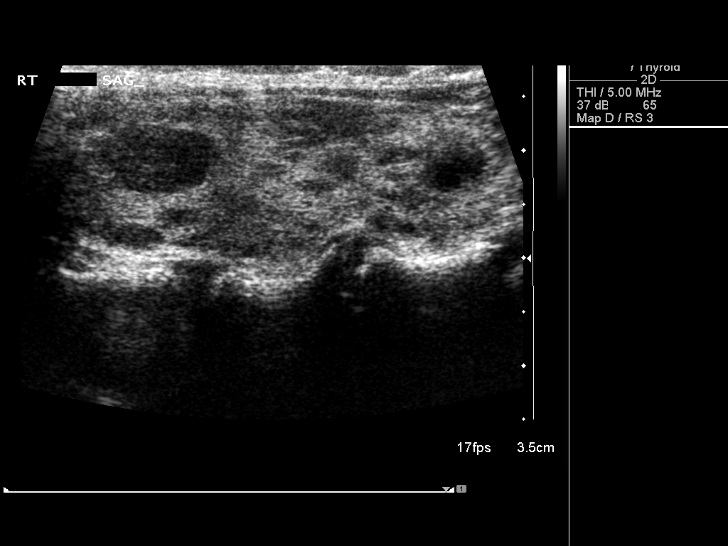
[im 28/52]
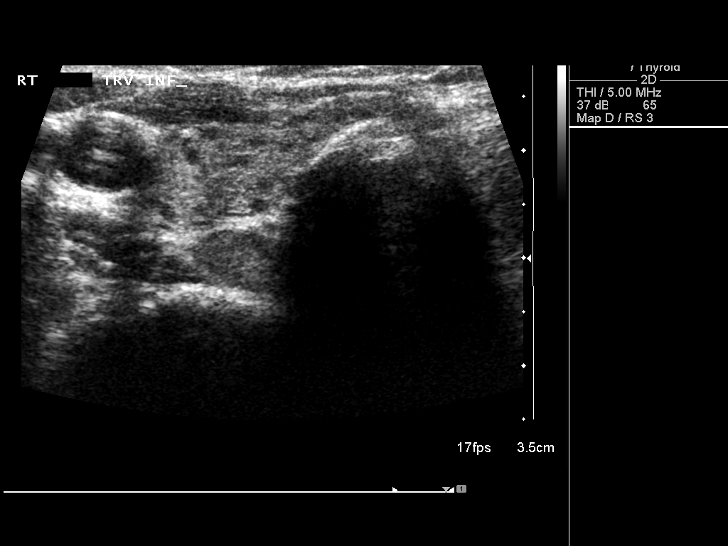
[im 32/52]
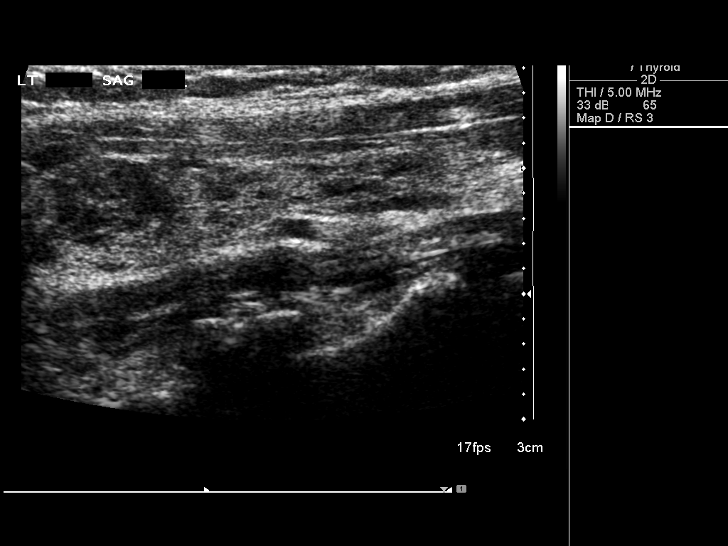
[im 35/52]
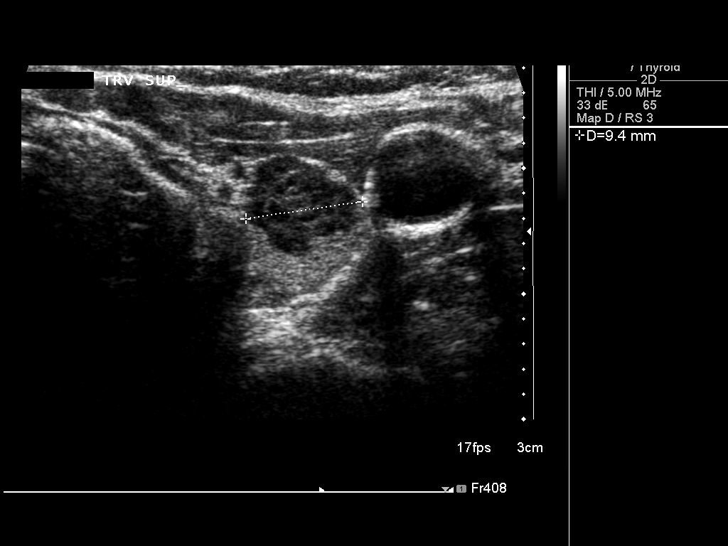
[im 39/52]
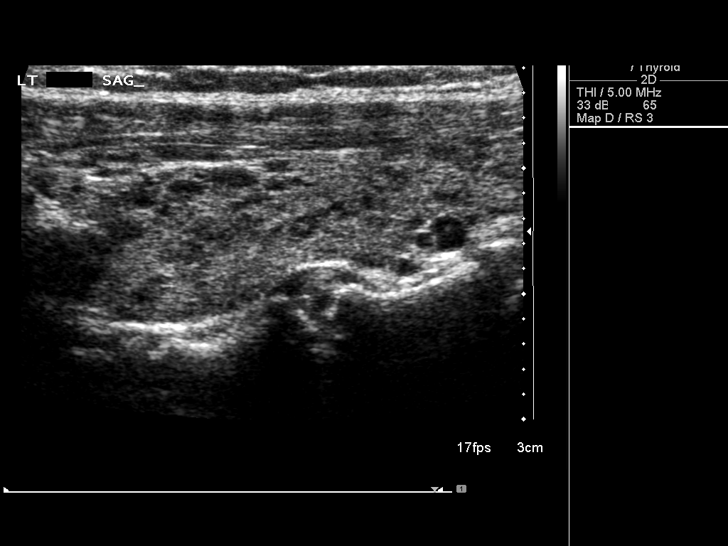
[im 43/52]
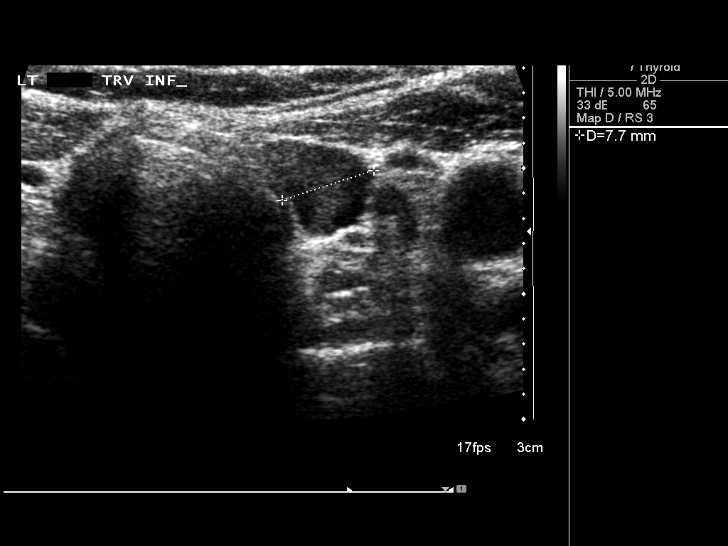
[im 47/52]
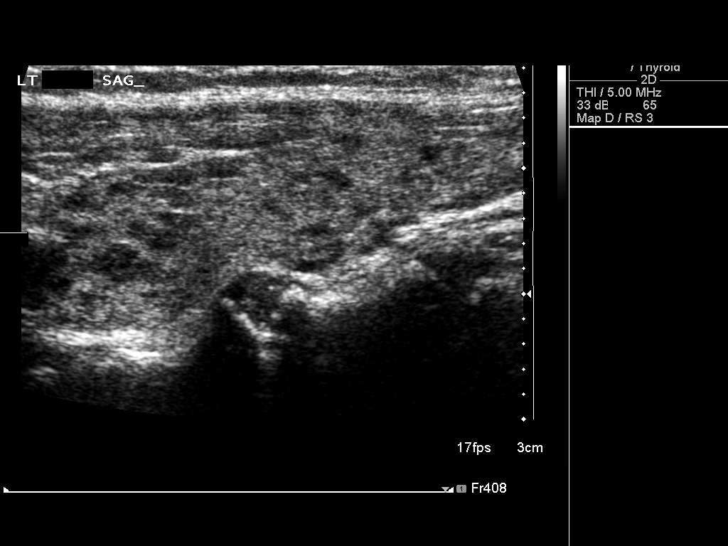
[im 52/52]
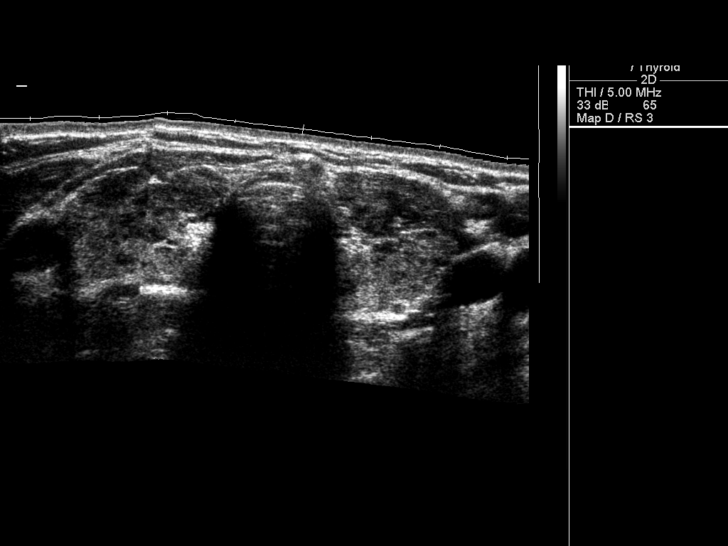

[14 of 25 positions shown; findings below may reference images not displayed]

FINDINGS: Right thyroid lobe:  4.9 x 1.8 x 2.1 cm.
Left thyroid lobe:  5.2 x 1.5 x 1.7 cm.
Isthmus:  3 mm in thickness.

Focal nodules:  The echogenicity of the thyroid gland is
inhomogeneous.  There are solid but hypoechoic nodules bilaterally.
The largest nodule is in the lower pole of the left lobe measuring
1.7 x 0.9 x 0.9 cm. Biopsy should be considered.  An additional
nodule in the lower pole measures 1.3 x 0.8 x 0.8 cm.  The nodule
in the upper pole on the left measures 1.0 x 0.7 x 0.9 cm.

There are two nodules on the right.  The larger is in the upper
pole measuring 1.4 x 0.8 x 1.0 cm.  A nodule in the lower pole on
the right measures 1.1 x 0.9 x 1.1 cm.

Lymphadenopathy:  None visualized.
IMPRESSION: The thyroid gland is inhomogeneous with multiple solid hypoechoic
nodules, the largest of 1.7 cm in diameter in the lower pole of the
left lobe. Consider biopsy of this dominant nodule.

## 2015-09-10 ENCOUNTER — Telehealth: Payer: Self-pay | Admitting: Emergency Medicine

## 2015-09-10 NOTE — Telephone Encounter (Signed)
error 

## 2015-09-25 ENCOUNTER — Encounter: Payer: Self-pay | Admitting: Nurse Practitioner

## 2015-09-25 ENCOUNTER — Other Ambulatory Visit (INDEPENDENT_AMBULATORY_CARE_PROVIDER_SITE_OTHER): Payer: Managed Care, Other (non HMO)

## 2015-09-25 ENCOUNTER — Other Ambulatory Visit (HOSPITAL_COMMUNITY)
Admission: RE | Admit: 2015-09-25 | Discharge: 2015-09-25 | Disposition: A | Discharge status: Home / Self Care | Payer: Managed Care, Other (non HMO) | Source: Ambulatory Visit | Attending: Nurse Practitioner | Admitting: Nurse Practitioner

## 2015-09-25 ENCOUNTER — Ambulatory Visit (INDEPENDENT_AMBULATORY_CARE_PROVIDER_SITE_OTHER): Payer: Managed Care, Other (non HMO) | Admitting: Nurse Practitioner

## 2015-09-25 VITALS — BP 122/88 | HR 82 | Temp 98.3°F | Ht 65.0 in | Wt 164.0 lb

## 2015-09-25 DIAGNOSIS — Z0000 Encounter for general adult medical examination without abnormal findings: Secondary | ICD-10-CM | POA: Diagnosis not present

## 2015-09-25 DIAGNOSIS — F339 Major depressive disorder, recurrent, unspecified: Secondary | ICD-10-CM | POA: Diagnosis not present

## 2015-09-25 DIAGNOSIS — E042 Nontoxic multinodular goiter: Secondary | ICD-10-CM

## 2015-09-25 DIAGNOSIS — Z1151 Encounter for screening for human papillomavirus (HPV): Secondary | ICD-10-CM | POA: Insufficient documentation

## 2015-09-25 DIAGNOSIS — E049 Nontoxic goiter, unspecified: Secondary | ICD-10-CM

## 2015-09-25 DIAGNOSIS — Z01419 Encounter for gynecological examination (general) (routine) without abnormal findings: Secondary | ICD-10-CM | POA: Diagnosis present

## 2015-09-25 DIAGNOSIS — N951 Menopausal and female climacteric states: Secondary | ICD-10-CM | POA: Insufficient documentation

## 2015-09-25 LAB — COMPREHENSIVE METABOLIC PANEL
ALK PHOS: 71 U/L (ref 39–117)
ALT: 15 U/L (ref 0–35)
AST: 16 U/L (ref 0–37)
Albumin: 4.1 g/dL (ref 3.5–5.2)
BILIRUBIN TOTAL: 0.7 mg/dL (ref 0.2–1.2)
BUN: 10 mg/dL (ref 6–23)
CALCIUM: 9 mg/dL (ref 8.4–10.5)
CO2: 31 meq/L (ref 19–32)
CREATININE: 0.81 mg/dL (ref 0.40–1.20)
Chloride: 102 mEq/L (ref 96–112)
GFR: 80.06 mL/min (ref >60.00)
Glucose, Bld: 90 mg/dL (ref 70–99)
Potassium: 3.8 mEq/L (ref 3.5–5.1)
Sodium: 139 mEq/L (ref 135–145)
TOTAL PROTEIN: 7.4 g/dL (ref 6.0–8.3)

## 2015-09-25 LAB — CBC WITH DIFFERENTIAL/PLATELET
BASOS PCT: 0.5 % (ref 0.0–3.0)
Basophils Absolute: 0 10*3/uL (ref 0.0–0.1)
EOS PCT: 1.3 % (ref 0.0–5.0)
Eosinophils Absolute: 0.1 10*3/uL (ref 0.0–0.7)
HCT: 37.1 % (ref 36.0–46.0)
Hemoglobin: 12.9 g/dL (ref 12.0–15.0)
LYMPHS ABS: 1.8 10*3/uL (ref 0.7–4.0)
Lymphocytes Relative: 27.7 % (ref 12.0–46.0)
MCHC: 34.8 g/dL (ref 30.0–36.0)
MCV: 88.5 fl (ref 78.0–100.0)
MONO ABS: 0.6 10*3/uL (ref 0.1–1.0)
Monocytes Relative: 8.8 % (ref 3.0–12.0)
NEUTROS PCT: 61.7 % (ref 43.0–77.0)
Neutro Abs: 3.9 10*3/uL (ref 1.4–7.7)
Platelets: 376 10*3/uL (ref 150.0–400.0)
RBC: 4.2 Mil/uL (ref 3.87–5.11)
RDW: 13.4 % (ref 11.5–15.5)
WBC: 6.4 10*3/uL (ref 4.0–10.5)

## 2015-09-25 LAB — TSH: TSH: 1.27 u[IU]/mL (ref 0.35–4.50)

## 2015-09-25 LAB — LIPID PANEL
CHOL/HDL RATIO: 2
CHOLESTEROL: 168 mg/dL (ref 0–200)
HDL: 73.4 mg/dL (ref >39.00)
LDL Cholesterol: 66 mg/dL (ref 0–99)
NonHDL: 94.31
TRIGLYCERIDES: 143 mg/dL (ref 0.0–149.0)
VLDL: 28.6 mg/dL (ref 0.0–40.0)

## 2015-09-25 LAB — T4, FREE: Free T4: 0.86 ng/dL (ref 0.60–1.60)

## 2015-09-25 MED ORDER — FLUOXETINE HCL 20 MG PO TABS: 20.0000 mg | ORAL_TABLET | Freq: Every day | ORAL | 2 refills | Status: DC

## 2015-09-25 NOTE — Assessment & Plan Note (Signed)
LMP 1362yrs ago

## 2015-09-25 NOTE — Patient Instructions (Signed)
We discussed common side effects such as nausea, drowsiness and weight gain.  Also discussed rare but serious side effect of suicide ideation.  She is instructed to discontinue medication go directly to ED if this occurs.  Pt verbalizes understanding.    Health Maintenance, Female Adopting a healthy lifestyle and getting preventive care can go a long way to promote health and wellness. Talk with your health care provider about what schedule of regular examinations is right for you. This is a good chance for you to check in with your provider about disease prevention and staying healthy. In between checkups, there are plenty of things you can do on your own. Experts have done a lot of research about which lifestyle changes and preventive measures are most likely to keep you healthy. Ask your health care provider for more information. WEIGHT AND DIET  Eat a healthy diet  Be sure to include plenty of vegetables, fruits, low-fat dairy products, and lean protein.  Do not eat a lot of foods high in solid fats, added sugars, or salt.  Get regular exercise. This is one of the most important things you can do for your health.  Most adults should exercise for at least 150 minutes each week. The exercise should increase your heart rate and make you sweat (moderate-intensity exercise).  Most adults should also do strengthening exercises at least twice a week. This is in addition to the moderate-intensity exercise.  Maintain a healthy weight  Body mass index (BMI) is a measurement that can be used to identify possible weight problems. It estimates body fat based on height and weight. Your health care provider can help determine your BMI and help you achieve or maintain a healthy weight.  For females 14 years of age and older:   A BMI below 18.5 is considered underweight.  A BMI of 18.5 to 24.9 is normal.  A BMI of 25 to 29.9 is considered overweight.  A BMI of 30 and above is considered obese.   Watch levels of cholesterol and blood lipids  You should start having your blood tested for lipids and cholesterol at 48 years of age, then have this test every 5 years.  You may need to have your cholesterol levels checked more often if:  Your lipid or cholesterol levels are high.  You are older than 48 years of age.  You are at high risk for heart disease.  CANCER SCREENING   Lung Cancer  Lung cancer screening is recommended for adults 24-61 years old who are at high risk for lung cancer because of a history of smoking.  A yearly low-dose CT scan of the lungs is recommended for people who:  Currently smoke.  Have quit within the past 15 years.  Have at least a 30-pack-year history of smoking. A pack year is smoking an average of one pack of cigarettes a day for 1 year.  Yearly screening should continue until it has been 15 years since you quit.  Yearly screening should stop if you develop a health problem that would prevent you from having lung cancer treatment.  Breast Cancer  Practice breast self-awareness. This means understanding how your breasts normally appear and feel.  It also means doing regular breast self-exams. Let your health care provider know about any changes, no matter how small.  If you are in your 20s or 30s, you should have a clinical breast exam (CBE) by a health care provider every 1-3 years as part of a regular health  exam.  If you are 40 or older, have a CBE every year. Also consider having a breast X-ray (mammogram) every year.  If you have a family history of breast cancer, talk to your health care provider about genetic screening.  If you are at high risk for breast cancer, talk to your health care provider about having an MRI and a mammogram every year.  Breast cancer gene (BRCA) assessment is recommended for women who have family members with BRCA-related cancers. BRCA-related cancers  include:  Breast.  Ovarian.  Tubal.  Peritoneal cancers.  Results of the assessment will determine the need for genetic counseling and BRCA1 and BRCA2 testing. Cervical Cancer Your health care provider may recommend that you be screened regularly for cancer of the pelvic organs (ovaries, uterus, and vagina). This screening involves a pelvic examination, including checking for microscopic changes to the surface of your cervix (Pap test). You may be encouraged to have this screening done every 3 years, beginning at age 40.  For women ages 65-65, health care providers may recommend pelvic exams and Pap testing every 3 years, or they may recommend the Pap and pelvic exam, combined with testing for human papilloma virus (HPV), every 5 years. Some types of HPV increase your risk of cervical cancer. Testing for HPV may also be done on women of any age with unclear Pap test results.  Other health care providers may not recommend any screening for nonpregnant women who are considered low risk for pelvic cancer and who do not have symptoms. Ask your health care provider if a screening pelvic exam is right for you.  If you have had past treatment for cervical cancer or a condition that could lead to cancer, you need Pap tests and screening for cancer for at least 20 years after your treatment. If Pap tests have been discontinued, your risk factors (such as having a new sexual partner) need to be reassessed to determine if screening should resume. Some women have medical problems that increase the chance of getting cervical cancer. In these cases, your health care provider may recommend more frequent screening and Pap tests. Colorectal Cancer  This type of cancer can be detected and often prevented.  Routine colorectal cancer screening usually begins at 48 years of age and continues through 48 years of age.  Your health care provider may recommend screening at an earlier age if you have risk factors for  colon cancer.  Your health care provider may also recommend using home test kits to check for hidden blood in the stool.  A small camera at the end of a tube can be used to examine your colon directly (sigmoidoscopy or colonoscopy). This is done to check for the earliest forms of colorectal cancer.  Routine screening usually begins at age 29.  Direct examination of the colon should be repeated every 5-10 years through 48 years of age. However, you may need to be screened more often if early forms of precancerous polyps or small growths are found. Skin Cancer  Check your skin from head to toe regularly.  Tell your health care provider about any new moles or changes in moles, especially if there is a change in a mole's shape or color.  Also tell your health care provider if you have a mole that is larger than the size of a pencil eraser.  Always use sunscreen. Apply sunscreen liberally and repeatedly throughout the day.  Protect yourself by wearing long sleeves, pants, a wide-brimmed hat, and sunglasses  whenever you are outside. HEART DISEASE, DIABETES, AND HIGH BLOOD PRESSURE   High blood pressure causes heart disease and increases the risk of stroke. High blood pressure is more likely to develop in:  People who have blood pressure in the high end of the normal range (130-139/85-89 mm Hg).  People who are overweight or obese.  People who are African American.  If you are 28-53 years of age, have your blood pressure checked every 3-5 years. If you are 16 years of age or older, have your blood pressure checked every year. You should have your blood pressure measured twice--once when you are at a hospital or clinic, and once when you are not at a hospital or clinic. Record the average of the two measurements. To check your blood pressure when you are not at a hospital or clinic, you can use:  An automated blood pressure machine at a pharmacy.  A home blood pressure monitor.  If you  are between 38 years and 59 years old, ask your health care provider if you should take aspirin to prevent strokes.  Have regular diabetes screenings. This involves taking a blood sample to check your fasting blood sugar level.  If you are at a normal weight and have a low risk for diabetes, have this test once every three years after 48 years of age.  If you are overweight and have a high risk for diabetes, consider being tested at a younger age or more often. PREVENTING INFECTION  Hepatitis B  If you have a higher risk for hepatitis B, you should be screened for this virus. You are considered at high risk for hepatitis B if:  You were born in a country where hepatitis B is common. Ask your health care provider which countries are considered high risk.  Your parents were born in a high-risk country, and you have not been immunized against hepatitis B (hepatitis B vaccine).  You have HIV or AIDS.  You use needles to inject street drugs.  You live with someone who has hepatitis B.  You have had sex with someone who has hepatitis B.  You get hemodialysis treatment.  You take certain medicines for conditions, including cancer, organ transplantation, and autoimmune conditions. Hepatitis C  Blood testing is recommended for:  Everyone born from [birth year] through 1963.  Anyone with known risk factors for hepatitis C. Sexually transmitted infections (STIs)  You should be screened for sexually transmitted infections (STIs) including gonorrhea and chlamydia if:  You are sexually active and are younger than 48 years of age.  You are older than 48 years of age and your health care provider tells you that you are at risk for this type of infection.  Your sexual activity has changed since you were last screened and you are at an increased risk for chlamydia or gonorrhea. Ask your health care provider if you are at risk.  If you do not have HIV, but are at risk, it may be recommended that you  take a prescription medicine daily to prevent HIV infection. This is called pre-exposure prophylaxis (PrEP). You are considered at risk if:  You are sexually active and do not regularly use condoms or know the HIV status of your partner(s).  You take drugs by injection.  You are sexually active with a partner who has HIV. Talk with your health care provider about whether you are at high risk of being infected with HIV. If you choose to begin PrEP, you should first be  tested for HIV. You should then be tested every 3 months for as long as you are taking PrEP.  PREGNANCY   If you are premenopausal and you may become pregnant, ask your health care provider about preconception counseling.  If you may become pregnant, take 400 to 800 micrograms (mcg) of folic acid every day.  If you want to prevent pregnancy, talk to your health care provider about birth control (contraception). OSTEOPOROSIS AND MENOPAUSE   Osteoporosis is a disease in which the bones lose minerals and strength with aging. This can result in serious bone fractures. Your risk for osteoporosis can be identified using a bone density scan.  If you are 34 years of age or older, or if you are at risk for osteoporosis and fractures, ask your health care provider if you should be screened.  Ask your health care provider whether you should take a calcium or vitamin D supplement to lower your risk for osteoporosis.  Menopause may have certain physical symptoms and risks.  Hormone replacement therapy may reduce some of these symptoms and risks. Talk to your health care provider about whether hormone replacement therapy is right for you.  HOME CARE INSTRUCTIONS   Schedule regular health, dental, and eye exams.  Stay current with your immunizations.   Do not use any tobacco products including cigarettes, chewing tobacco, or electronic cigarettes.  If you are pregnant, do not drink alcohol.  If you are breastfeeding, limit how  much and how often you drink alcohol.  Limit alcohol intake to no more than 1 drink per day for nonpregnant women. One drink equals 12 ounces of beer, 5 ounces of wine, or 1 ounces of hard liquor.  Do not use street drugs.  Do not share needles.  Ask your health care provider for help if you need support or information about quitting drugs.  Tell your health care provider if you often feel depressed.  Tell your health care provider if you have ever been abused or do not feel safe at home.   This information is not intended to replace advice given to you by your health care provider. Make sure you discuss any questions you have with your health care provider.   Document Released: 07/07/2010 Document Revised: 01/12/2014 Document Reviewed: 11/23/2012 Elsevier Interactive Patient Education [employer].

## 2015-09-25 NOTE — Progress Notes (Signed)
Normal results, see office note

## 2015-09-25 NOTE — Progress Notes (Signed)
Subjective:    Patient ID: Jennifer Allen, female    DOB: [birth year], 50 y.o.   MRN: [id number]  Patient presents today for complete physical or establish care (new patient) and   HPI Denies any acute complains.  Depression: Under control with prozac.  Immunizations: (TDAP, Hep C screen, Pneumovax, Influenza, zoster)  Health Maintenance  Topic Date Due  . HIV Screening  04/15/1982  . Flu Shot  04/04/2016*  . Tetanus Vaccine  05/19/2018  . Pap Smear  09/25/2018  *Topic was postponed. The date shown is not the original due date.   Diet:healthy, gluten Weight:  Wt Readings from Last 3 Encounters:  09/25/15 164 lb (74.4 kg)  05/09/14 165 lb 1.9 oz (74.9 kg)  12/07/13 170 lb (77.1 kg)   Exercise: walking, yoga Fall Risk: Fall Risk  01/11/2013  Falls in the past year? No   Depression/Suicide:under control with prozac [facility] 01/11/2013  Decreased Interest 0  Down, Depressed, Hopeless 0  PHQ - 2 Score 0   No flowsheet data found.  Pap Smear (every 5744yrs for >21-29 without HPV, every 8822yrs for >30-39622yrs with HPV):due Mammogram (yearly, >51422yrs):due Vision:uptodate, corrective len Dental: uptodate Advanced Directive:no, information provided No flowsheet data found. Sexual History (birth control, marital status, STD):no menstrual cycle in 48yrs, married with 1child  Medications and allergies reviewed with patient and updated if appropriate.  Patient Active Problem List   Diagnosis Date Noted  . Menopausal state 09/25/2015  . Cough 05/09/2014  . Goiter, nodular   . Major depression (HCC) 05/18/2008    No current outpatient prescriptions on file prior to visit.   No current facility-administered medications on file prior to visit.     Past Medical History:  Diagnosis Date  . Allergic rhinitis, cause unspecified   . HYPERTHYROIDISM 10/28/2009   incidental CPX dx  . POSTPARTUM DEPRESSION 2008    Past Surgical History:  Procedure Laterality Date    . APPENDECTOMY  1995  . CESAREAN SECTION  2008  . TONSILLECTOMY  1984    Social History   Social History  . Marital status: Married    Spouse name: N/A  . Number of children: N/A  . Years of education: N/A   Social History Main Topics  . Smoking status: Never Smoker  . 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 Alcohol use Yes  . Drug use: No  . Sexual activity: Not Asked   Other Topics Concern  . None   Social History Narrative  . None    Family History  Problem Relation Age of Onset  . Arthritis Mother   . Hypothyroidism Mother   . Fibromyalgia Mother   . Arthritis Father   . Alcohol abuse Other   . Arthritis Other   . Prostate cancer Other         Review of Systems  Constitutional: Negative for fever, malaise/fatigue and weight loss.  HENT: Negative for congestion and sore throat.   Eyes:       Negative for visual changes  Respiratory: Negative for cough and shortness of breath.   Cardiovascular: Negative for chest pain, palpitations and leg swelling.  Gastrointestinal: Negative for blood in stool, constipation, diarrhea and heartburn.  Genitourinary: Negative for dysuria, frequency and urgency.  Musculoskeletal: Negative for falls, joint pain and myalgias.  Skin: Negative for rash.  Neurological: Negative for dizziness, sensory change and headaches.  Endo/Heme/Allergies: Negative for  environmental allergies. Does not bruise/bleed easily.  Psychiatric/Behavioral: Positive for depression. Negative for substance abuse and suicidal ideas. The patient is not nervous/anxious.        Controlled with prozac    Objective:   Vitals:   09/25/15 1105  BP: 122/88  Pulse: 82  Temp: 98.3 F (36.8 C)    Body mass index is 27.29 kg/m.   Physical Examination:  Physical Exam  Constitutional: She is oriented to person, place, and time and well-developed, well-nourished, and in no distress. No  distress.  HENT:  Right Ear: External ear normal.  Left Ear: External ear normal.  Nose: Nose normal.  Mouth/Throat: No oropharyngeal exudate.  Eyes: Conjunctivae and EOM are normal. Pupils are equal, round, and reactive to light. No scleral icterus.  Neck: Normal range of motion. Neck supple. Thyroid mass and thyromegaly present.  Cardiovascular: Normal rate, regular rhythm, normal heart sounds and intact distal pulses.   Pulmonary/Chest: Effort normal and breath sounds normal. Right breast exhibits tenderness. Right breast exhibits no inverted nipple, no mass, no nipple discharge and no skin change. Left breast exhibits no inverted nipple, no mass, no nipple discharge, no skin change and no tenderness. Breasts are symmetrical.  Dense breast tissue (normal per patient) Screening mammogram ordered.  Abdominal: Soft. Bowel sounds are normal. She exhibits no distension. There is no tenderness.  Genitourinary: Rectum normal, cervix normal, right adnexa normal, left adnexa normal and vulva normal. Rectal exam shows no external hemorrhoid. Cervix exhibits no motion tenderness. Vagina exhibits abnormal mucosa. Thin  odorless  white and vaginal discharge found.  Genitourinary Comments: Pale vaginal mucosa  Musculoskeletal: Normal range of motion. She exhibits no edema or tenderness.  Lymphadenopathy:    She has no cervical adenopathy.  Neurological: She is alert and oriented to person, place, and time. Gait normal.  Skin: Skin is warm and dry.  Psychiatric: Affect and judgment normal.  Vitals reviewed.   ASSESSMENT and PLAN:  Aletta was seen today for annual exam.  Diagnoses and all orders for this visit:  Encounter for preventive health examination -     Comprehensive metabolic panel -     CBC w/Diff; Future -     TSH; Future -     T4, free; Future -     Lipid Profile; Future -     Cytology - PAP -     MM DIGITAL SCREENING BILATERAL; Future  Goiter, nodular -     TSH; Future -      T4, free; Future  Recurrent major depressive disorder, remission status unspecified (HCC) -     FLUoxetine (PROZAC) 20 MG tablet; Take 1 tablet (20 mg total) by mouth daily.   Menopausal state LMP 91yrs ago     Follow up: Return in about 1 year (around 09/24/2016), or if symptoms worsen or fail to improve, for CPE.  Alysia Penna, NP

## 2015-09-25 NOTE — Progress Notes (Signed)
Pre visit review using our clinic review tool, if applicable. No additional management support is needed unless otherwise documented below in the visit note. 

## 2015-09-26 LAB — CYTOLOGY - PAP

## 2015-09-26 NOTE — Progress Notes (Signed)
Normal results, see office note

## 2015-10-11 ENCOUNTER — Other Ambulatory Visit: Payer: Self-pay | Admitting: Nurse Practitioner

## 2015-10-11 DIAGNOSIS — Z1231 Encounter for screening mammogram for malignant neoplasm of breast: Secondary | ICD-10-CM

## 2016-01-13 ENCOUNTER — Ambulatory Visit
Admission: RE | Admit: 2016-01-13 | Discharge: 2016-01-13 | Disposition: A | Discharge status: Home / Self Care | Payer: Managed Care, Other (non HMO) | Source: Ambulatory Visit | Attending: Nurse Practitioner | Admitting: Nurse Practitioner

## 2016-01-13 DIAGNOSIS — Z1231 Encounter for screening mammogram for malignant neoplasm of breast: Secondary | ICD-10-CM

## 2018-05-09 ENCOUNTER — Telehealth: Payer: Self-pay | Admitting: Nurse Practitioner

## 2018-05-09 NOTE — Telephone Encounter (Signed)
Called and left vm for patient. Calling to schedule virtual visit with Jennifer Allen.  °

## 2018-10-24 ENCOUNTER — Other Ambulatory Visit: Payer: Self-pay

## 2018-10-24 DIAGNOSIS — Z20822 Contact with and (suspected) exposure to covid-19: Secondary | ICD-10-CM

## 2018-10-25 LAB — NOVEL CORONAVIRUS, NAA: SARS-CoV-2, NAA: DETECTED — AB

## 2018-12-19 ENCOUNTER — Other Ambulatory Visit: Payer: Self-pay

## 2018-12-20 ENCOUNTER — Ambulatory Visit (INDEPENDENT_AMBULATORY_CARE_PROVIDER_SITE_OTHER): Payer: 59 | Admitting: Internal Medicine

## 2018-12-20 ENCOUNTER — Encounter: Payer: Self-pay | Admitting: Internal Medicine

## 2018-12-20 VITALS — BP 120/84 | HR 96 | Temp 97.2°F | Ht 65.0 in | Wt 148.6 lb

## 2018-12-20 DIAGNOSIS — Z0000 Encounter for general adult medical examination without abnormal findings: Secondary | ICD-10-CM

## 2018-12-20 DIAGNOSIS — Z124 Encounter for screening for malignant neoplasm of cervix: Secondary | ICD-10-CM

## 2018-12-20 DIAGNOSIS — Z1239 Encounter for other screening for malignant neoplasm of breast: Secondary | ICD-10-CM

## 2018-12-20 DIAGNOSIS — Z23 Encounter for immunization: Secondary | ICD-10-CM | POA: Diagnosis not present

## 2018-12-20 NOTE — Progress Notes (Signed)
Established Patient Office Visit     This visit occurred during the SARS-CoV-2 public health emergency.  Safety protocols were in place, including screening questions prior to the visit, additional usage of staff PPE, and extensive cleaning of exam room while observing appropriate contact time as indicated for disinfecting solutions.    CC/Reason for Visit: Establish care, annual preventive exam  HPI: Jennifer Allen is a 53 y.o. female who is coming in today for the above mentioned reasons. 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  She was also diagnosed with Covid back in October but has recovered well.  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  She is a never smoker, drinks alcohol occasionally family history is nonsignificant.  She has no acute complaints.  She is due for flu, tetanus, shingles vaccination.  Regards to health maintenance she is due for colon, breast, cervical cancer screening.   Past Medical/Surgical History: Past Medical History:  Diagnosis Date  . Allergic rhinitis, cause unspecified   . HYPERTHYROIDISM 10/28/2009   incidental CPX dx  . POSTPARTUM DEPRESSION 2008    Past Surgical History:  Procedure Laterality Date  . APPENDECTOMY  1995  . CESAREAN SECTION  2008  . TONSILLECTOMY  1984    Social History:  reports that she has never smoked. She has never used smokeless tobacco. She reports current alcohol use. She reports that she does not use drugs.  Allergies: Allergies  Allergen Reactions  . Sulfonamide Derivatives     REACTION: family hx of severe allergy    Family History:  Family History  Problem Relation Age of Onset  . Arthritis Mother   .  Hypothyroidism Mother   . Fibromyalgia Mother   . Arthritis Father   . Alcohol abuse Other   . Arthritis Other   . Prostate cancer Other     No current outpatient medications on file.  Review of Systems:  Constitutional: Denies fever, chills, diaphoresis, appetite change and fatigue.  HEENT: Denies photophobia, eye pain, redness, hearing loss, ear pain, congestion, sore throat, rhinorrhea, sneezing, mouth sores, trouble swallowing, neck pain, neck stiffness and tinnitus.   Respiratory: Denies SOB, DOE, cough, chest tightness,  and wheezing.   Cardiovascular: Denies chest pain, palpitations and leg swelling.  Gastrointestinal: Denies nausea, vomiting, abdominal pain, diarrhea, constipation, blood in stool and abdominal distention.  Genitourinary: Denies dysuria, urgency, frequency, hematuria, flank pain and difficulty urinating.  Endocrine: Denies: hot or cold intolerance, sweats, changes in hair or nails, polyuria, polydipsia. Musculoskeletal: Denies myalgias, back pain, joint swelling, arthralgias and gait problem.  Skin: Denies pallor, rash and wound.  Neurological: Denies dizziness, seizures, syncope, weakness, light-headedness, numbness and headaches.  Hematological: Denies adenopathy. Easy bruising, personal or family bleeding history  Psychiatric/Behavioral: Denies suicidal ideation, mood changes, confusion, nervousness, sleep disturbance and agitation    Physical Exam: Vitals:   12/20/18 1107  BP: 120/84  Pulse: 96  Temp: (!) 97.2 F (36.2 C)  TempSrc: Temporal  SpO2: 99%  Weight: 148 lb 9.6 oz (67.4 kg)  Height: '5\' 5"'  (1.651 m)    Body mass index is 24.73 kg/m.   Constitutional: NAD, calm, comfortable Eyes: PERRL, lids and conjunctivae normal, wears corrective  lenses ENMT: Mucous membranes are moist.  Tympanic membrane is pearly white, no erythema or bulging. Neck: normal, supple, no masses, no thyromegaly Respiratory: clear to auscultation bilaterally, no  wheezing, no crackles. Normal respiratory effort. No accessory muscle use.  Cardiovascular: Regular rate and rhythm, no murmurs / rubs / gallops. No extremity edema. 2+ pedal pulses.   Abdomen: no tenderness, no masses palpated. No hepatosplenomegaly. Bowel sounds positive.  Musculoskeletal: no clubbing / cyanosis. No joint deformity upper and lower extremities. Good ROM, no contractures. Normal muscle tone.  Skin: no rashes, lesions, ulcers. No induration.  Small cranial sebaceous cyst. Neurologic: CN 2-12 grossly intact. Sensation intact, DTR normal. Strength 5/5 in all 4.  Psychiatric: Normal judgment and insight. Alert and oriented x 3. Normal mood.    Impression and Plan:  Encounter for preventive health examination  -I have advised routine eye and dental care. -She has agreed to receive tetanus and shingles vaccination today, has refused flu. -She will return for screening labs as she is not fasting today. -Healthy lifestyle has been discussed in detail. -Mammogram has been requested. -GYN referral has been placed. -She has declined colonoscopy or Cologuard for colon cancer screening despite extensive counseling.   Patient Instructions  -Nice seeing you today!!  -Return fasting for lab work.  -tetanus and shingles vaccines today.  -Think about flu vaccine and colonoscopy.  -Schedule follow up in 1 year or sooner as needed.   Preventive Care 32-25 Years Old, Female Preventive care refers to visits with your health care provider and lifestyle choices that can promote health and wellness. This includes:  A yearly physical exam. This may also be called an annual well check.  Regular dental visits and eye exams.  Immunizations.  Screening for certain conditions.  Healthy lifestyle choices, such as eating a healthy diet, getting regular exercise, not using drugs or products that contain nicotine and tobacco, and limiting alcohol use. What can I expect for my preventive  care visit? Physical exam Your health care provider will check your:  Height and weight. This may be used to calculate body mass index (BMI), which tells if you are at a healthy weight.  Heart rate and blood pressure.  Skin for abnormal spots. Counseling Your health care provider may ask you questions about your:  Alcohol, tobacco, and drug use.  Emotional well-being.  Home and relationship well-being.  Sexual activity.  Eating habits.  Work and work Statistician.  Method of birth control.  Menstrual cycle.  Pregnancy history. What immunizations do I need?  Influenza (flu) vaccine  This is recommended every year. Tetanus, diphtheria, and pertussis (Tdap) vaccine  You may need a Td booster every 10 years. Varicella (chickenpox) vaccine  You may need this if you have not been vaccinated. Zoster (shingles) vaccine  You may need this after age 5. Measles, mumps, and rubella (MMR) vaccine  You may need at least one dose of MMR if you were born in 1957 or later. You may also need a second dose. Pneumococcal conjugate (PCV13) vaccine  You may need this if you have certain conditions and were not previously vaccinated. Pneumococcal polysaccharide (PPSV23) vaccine  You may need one or two doses if you smoke cigarettes or if you have certain conditions. Meningococcal conjugate (MenACWY) vaccine  You may need this if you have certain conditions. Hepatitis A vaccine  You may need this if you have certain conditions or if you travel or work in places where you may be exposed to hepatitis A.  Hepatitis B vaccine  You may need this if you have certain conditions or if you travel or work in places where you may be exposed to hepatitis B. Haemophilus influenzae type b (Hib) vaccine  You may need this if you have certain conditions. Human papillomavirus (HPV) vaccine  If recommended by your health care provider, you may need three doses over 6 months. You may receive  vaccines as individual doses or as more than one vaccine together in one shot (combination vaccines). Talk with your health care provider about the risks and benefits of combination vaccines. What tests do I need? Blood tests  Lipid and cholesterol levels. These may be checked every 5 years, or more frequently if you are over 2 years old.  Hepatitis C test.  Hepatitis B test. Screening  Lung cancer screening. You may have this screening every year starting at age 82 if you have a 30-pack-year history of smoking and currently smoke or have quit within the past 15 years.  Colorectal cancer screening. All adults should have this screening starting at age 68 and continuing until age 9. Your health care provider may recommend screening at age 34 if you are at increased risk. You will have tests every 1-10 years, depending on your results and the type of screening test.  Diabetes screening. This is done by checking your blood sugar (glucose) after you have not eaten for a while (fasting). You may have this done every 1-3 years.  Mammogram. This may be done every 1-2 years. Talk with your health care provider about when you should start having regular mammograms. This may depend on whether you have a family history of breast cancer.  BRCA-related cancer screening. This may be done if you have a family history of breast, ovarian, tubal, or peritoneal cancers.  Pelvic exam and Pap test. This may be done every 3 years starting at age 16. Starting at age 50, this may be done every 5 years if you have a Pap test in combination with an HPV test. Other tests  Sexually transmitted disease (STD) testing.  Bone density scan. This is done to screen for osteoporosis. You may have this scan if you are at high risk for osteoporosis. Follow these instructions at home: Eating and drinking  Eat a diet that includes fresh fruits and vegetables, whole grains, lean protein, and low-fat dairy.  Take vitamin  and mineral supplements as recommended by your health care provider.  Do not drink alcohol if: ? Your health care provider tells you not to drink. ? You are pregnant, may be pregnant, or are planning to become pregnant.  If you drink alcohol: ? Limit how much you have to 0-1 drink a day. ? Be aware of how much alcohol is in your drink. In the [place] one drink equals one 12 oz bottle of beer (355 mL), one 5 oz glass of wine (148 mL), or one 1 oz glass of hard liquor (44 mL). Lifestyle  Take daily care of your teeth and gums.  Stay active. Exercise for at least 30 minutes on 5 or more days each week.  Do not use any products that contain nicotine or tobacco, such as cigarettes, e-cigarettes, and chewing tobacco. If you need help quitting, ask your health care provider.  If you are sexually active, practice safe sex. Use a condom or other form of birth control (contraception) in order to prevent pregnancy and STIs (sexually transmitted infections).  If told by your health care provider, take  low-dose aspirin daily starting at age 34. What's next?  Visit your health care provider once a year for a well check visit.  Ask your health care provider how often you should have your eyes and teeth checked.  Stay up to date on all vaccines. This information is not intended to replace advice given to you by your health care provider. Make sure you discuss any questions you have with your health care provider. Document Released: 01/18/2015 Document Revised: 09/02/2017 Document Reviewed: 09/02/2017 Elsevier Patient Education  2020 [place], [place] [facility]

## 2018-12-20 NOTE — Patient Instructions (Signed)
-Nice seeing you today!!  -Return fasting for lab work.  -tetanus and shingles vaccines today.  -Think about flu vaccine and colonoscopy.  -Schedule follow up in 1 year or sooner as needed.   Preventive Care 30-51 Years Old, Female Preventive care refers to visits with your health care provider and lifestyle choices that can promote health and wellness. This includes:  A yearly physical exam. This may also be called an annual well check.  Regular dental visits and eye exams.  Immunizations.  Screening for certain conditions.  Healthy lifestyle choices, such as eating a healthy diet, getting regular exercise, not using drugs or products that contain nicotine and tobacco, and limiting alcohol use. What can I expect for my preventive care visit? Physical exam Your health care provider will check your:  Height and weight. This may be used to calculate body mass index (BMI), which tells if you are at a healthy weight.  Heart rate and blood pressure.  Skin for abnormal spots. Counseling Your health care provider may ask you questions about your:  Alcohol, tobacco, and drug use.  Emotional well-being.  Home and relationship well-being.  Sexual activity.  Eating habits.  Work and work Statistician.  Method of birth control.  Menstrual cycle.  Pregnancy history. What immunizations do I need?  Influenza (flu) vaccine  This is recommended every year. Tetanus, diphtheria, and pertussis (Tdap) vaccine  You may need a Td booster every 10 years. Varicella (chickenpox) vaccine  You may need this if you have not been vaccinated. Zoster (shingles) vaccine  You may need this after age 68. Measles, mumps, and rubella (MMR) vaccine  You may need at least one dose of MMR if you were born in 1957 or later. You may also need a second dose. Pneumococcal conjugate (PCV13) vaccine  You may need this if you have certain conditions and were not previously  vaccinated. Pneumococcal polysaccharide (PPSV23) vaccine  You may need one or two doses if you smoke cigarettes or if you have certain conditions. Meningococcal conjugate (MenACWY) vaccine  You may need this if you have certain conditions. Hepatitis A vaccine  You may need this if you have certain conditions or if you travel or work in places where you may be exposed to hepatitis A. Hepatitis B vaccine  You may need this if you have certain conditions or if you travel or work in places where you may be exposed to hepatitis B. Haemophilus influenzae type b (Hib) vaccine  You may need this if you have certain conditions. Human papillomavirus (HPV) vaccine  If recommended by your health care provider, you may need three doses over 6 months. You may receive vaccines as individual doses or as more than one vaccine together in one shot (combination vaccines). Talk with your health care provider about the risks and benefits of combination vaccines. What tests do I need? Blood tests  Lipid and cholesterol levels. These may be checked every 5 years, or more frequently if you are over 55 years old.  Hepatitis C test.  Hepatitis B test. Screening  Lung cancer screening. You may have this screening every year starting at age 15 if you have a 30-pack-year history of smoking and currently smoke or have quit within the past 15 years.  Colorectal cancer screening. All adults should have this screening starting at age 17 and continuing until age 81. Your health care provider may recommend screening at age 31 if you are at increased risk. You will have tests every 1-10  years, depending on your results and the type of screening test.  Diabetes screening. This is done by checking your blood sugar (glucose) after you have not eaten for a while (fasting). You may have this done every 1-3 years.  Mammogram. This may be done every 1-2 years. Talk with your health care provider about when you should start  having regular mammograms. This may depend on whether you have a family history of breast cancer.  BRCA-related cancer screening. This may be done if you have a family history of breast, ovarian, tubal, or peritoneal cancers.  Pelvic exam and Pap test. This may be done every 3 years starting at age 26. Starting at age 84, this may be done every 5 years if you have a Pap test in combination with an HPV test. Other tests  Sexually transmitted disease (STD) testing.  Bone density scan. This is done to screen for osteoporosis. You may have this scan if you are at high risk for osteoporosis. Follow these instructions at home: Eating and drinking  Eat a diet that includes fresh fruits and vegetables, whole grains, lean protein, and low-fat dairy.  Take vitamin and mineral supplements as recommended by your health care provider.  Do not drink alcohol if: ? Your health care provider tells you not to drink. ? You are pregnant, may be pregnant, or are planning to become pregnant.  If you drink alcohol: ? Limit how much you have to 0-1 drink a day. ? Be aware of how much alcohol is in your drink. In the [place] one drink equals one 12 oz bottle of beer (355 mL), one 5 oz glass of wine (148 mL), or one 1 oz glass of hard liquor (44 mL). Lifestyle  Take daily care of your teeth and gums.  Stay active. Exercise for at least 30 minutes on 5 or more days each week.  Do not use any products that contain nicotine or tobacco, such as cigarettes, e-cigarettes, and chewing tobacco. If you need help quitting, ask your health care provider.  If you are sexually active, practice safe sex. Use a condom or other form of birth control (contraception) in order to prevent pregnancy and STIs (sexually transmitted infections).  If told by your health care provider, take low-dose aspirin daily starting at age 20. What's next?  Visit your health care provider once a year for a well check visit.  Ask your health  care provider how often you should have your eyes and teeth checked.  Stay up to date on all vaccines. This information is not intended to replace advice given to you by your health care provider. Make sure you discuss any questions you have with your health care provider. Document Released: 01/18/2015 Document Revised: 09/02/2017 Document Reviewed: 09/02/2017 Elsevier Patient Education  2020 [employer].

## 2018-12-20 NOTE — Addendum Note (Signed)
Addended by: Westley Hummer B on: 12/20/2018 05:18 PM   Modules accepted: Orders

## 2018-12-21 ENCOUNTER — Other Ambulatory Visit: Payer: 59

## 2018-12-26 ENCOUNTER — Other Ambulatory Visit: Payer: 59

## 2019-02-13 ENCOUNTER — Ambulatory Visit: Payer: BLUE CROSS/BLUE SHIELD | Admitting: Women's Health

## 2019-02-16 ENCOUNTER — Other Ambulatory Visit: Payer: Self-pay

## 2019-02-16 ENCOUNTER — Encounter: Payer: Self-pay | Admitting: Internal Medicine

## 2019-02-16 ENCOUNTER — Other Ambulatory Visit (INDEPENDENT_AMBULATORY_CARE_PROVIDER_SITE_OTHER): Payer: 59

## 2019-02-16 DIAGNOSIS — Z0000 Encounter for general adult medical examination without abnormal findings: Secondary | ICD-10-CM

## 2019-02-16 DIAGNOSIS — E538 Deficiency of other specified B group vitamins: Secondary | ICD-10-CM | POA: Insufficient documentation

## 2019-02-16 LAB — COMPREHENSIVE METABOLIC PANEL
ALT: 19 U/L (ref 0–35)
AST: 17 U/L (ref 0–37)
Albumin: 4.2 g/dL (ref 3.5–5.2)
Alkaline Phosphatase: 66 U/L (ref 39–117)
BUN: 14 mg/dL (ref 6–23)
CO2: 31 mEq/L (ref 19–32)
Calcium: 9.3 mg/dL (ref 8.4–10.5)
Chloride: 102 mEq/L (ref 96–112)
Creatinine, Ser: 0.83 mg/dL (ref 0.40–1.20)
GFR: 72.23 mL/min (ref >60.00)
Glucose, Bld: 84 mg/dL (ref 70–99)
Potassium: 4.1 mEq/L (ref 3.5–5.1)
Sodium: 138 mEq/L (ref 135–145)
Total Bilirubin: 0.9 mg/dL (ref 0.2–1.2)
Total Protein: 7.2 g/dL (ref 6.0–8.3)

## 2019-02-16 LAB — LIPID PANEL
Cholesterol: 181 mg/dL (ref 0–200)
HDL: 105.7 mg/dL (ref >39.00)
LDL Cholesterol: 62 mg/dL (ref 0–99)
NonHDL: 75.17
Total CHOL/HDL Ratio: 2
Triglycerides: 65 mg/dL (ref 0.0–149.0)
VLDL: 13 mg/dL (ref 0.0–40.0)

## 2019-02-16 LAB — HEMOGLOBIN A1C: Hgb A1c MFr Bld: 5.6 % (ref 4.6–6.5)

## 2019-02-16 LAB — CBC WITH DIFFERENTIAL/PLATELET
Basophils Absolute: 0.1 10*3/uL (ref 0.0–0.1)
Basophils Relative: 1 % (ref 0.0–3.0)
Eosinophils Absolute: 0.2 10*3/uL (ref 0.0–0.7)
Eosinophils Relative: 2.8 % (ref 0.0–5.0)
HCT: 40.1 % (ref 36.0–46.0)
Hemoglobin: 13.6 g/dL (ref 12.0–15.0)
Lymphocytes Relative: 34.8 % (ref 12.0–46.0)
Lymphs Abs: 2 10*3/uL (ref 0.7–4.0)
MCHC: 33.9 g/dL (ref 30.0–36.0)
MCV: 92.9 fl (ref 78.0–100.0)
Monocytes Absolute: 0.6 10*3/uL (ref 0.1–1.0)
Monocytes Relative: 10.3 % (ref 3.0–12.0)
Neutro Abs: 3 10*3/uL (ref 1.4–7.7)
Neutrophils Relative %: 51.1 % (ref 43.0–77.0)
Platelets: 342 10*3/uL (ref 150.0–400.0)
RBC: 4.32 Mil/uL (ref 3.87–5.11)
RDW: 12.9 % (ref 11.5–15.5)
WBC: 5.9 10*3/uL (ref 4.0–10.5)

## 2019-02-16 LAB — VITAMIN D 25 HYDROXY (VIT D DEFICIENCY, FRACTURES): VITD: 36.84 ng/mL (ref 30.00–100.00)

## 2019-02-16 LAB — VITAMIN B12: Vitamin B-12: 151 pg/mL — ABNORMAL LOW (ref 211–911)

## 2019-02-16 LAB — TSH: TSH: 1.44 u[IU]/mL (ref 0.35–4.50)

## 2019-02-20 ENCOUNTER — Ambulatory Visit: Payer: 59

## 2019-02-21 ENCOUNTER — Telehealth (INDEPENDENT_AMBULATORY_CARE_PROVIDER_SITE_OTHER): Payer: 59 | Admitting: Family

## 2019-02-21 ENCOUNTER — Other Ambulatory Visit: Payer: Self-pay

## 2019-02-21 DIAGNOSIS — D510 Vitamin B12 deficiency anemia due to intrinsic factor deficiency: Secondary | ICD-10-CM

## 2019-02-21 DIAGNOSIS — D518 Other vitamin B12 deficiency anemias: Secondary | ICD-10-CM

## 2019-02-21 DIAGNOSIS — R5383 Other fatigue: Secondary | ICD-10-CM

## 2019-02-21 MED ORDER — CYANOCOBALAMIN 1000 MCG/ML IJ SOLN: INTRAMUSCULAR | 1 refills | Status: DC

## 2019-02-21 MED ORDER — "BD SAFETYGLIDE SYRINGE/NEEDLE 25G X 1"" 3 ML MISC": 2 refills | Status: AC

## 2019-02-21 NOTE — Patient Instructions (Signed)
Pernicious Anemia  Anemia is a condition in which the body does not have enough red blood cells or hemoglobin. Hemoglobin is a substance in red blood cells that carries oxygen. Pernicious anemia happens when the body does not have enough of a protein made in the stomach called intrinsic factor (IF). Without enough IF, your digestive tract cannot absorb enough of the vitamin B12 that your body needs to make red blood cells. Normally, you can get enough vitamin B12 from eating foods such as meat, poultry, eggs, and dairy products. If you have pernicious anemia, you do not absorb enough vitamin B12 from your diet, and anemia develops over time. When you have anemia, your organs may not work properly and you may feel very tired. Untreated pernicious anemia can lead to severe symptoms of anemia, including chest pain, heart failure, and permanent nervous system damage. What are the causes? The cause of this condition is not known. Pernicious anemia is believed to be an autoimmune disease. When you have an autoimmune disease, your body's defense system (immune system) mistakenly attacks normal cells in your body. With pernicious anemia, the immune system attacks cells that line the inside of the stomach (parietal cells). These cells secrete stomach acids and IF. What increases the risk? You are more likely to develop this condition if you:  Are older than age 40.  Have a family history of pernicious anemia.  Are of Northern European or Scandinavian descent.  Have another type of autoimmune disease. What are the signs or symptoms? Pernicious anemia symptoms may take many years to develop. This condition usually does not cause symptoms until after a person is older than age 30. Signs and symptoms due to anemia include:  Tiredness.  Light-headedness.  A sore tongue or a burning sensation on the tongue.  A smooth red tongue.  Shortness of breath.  Fast heartbeat.  Chest pain. Stomach symptoms  due to gradual loss of parietal cells include:  Nausea or vomiting.  Heartburn.  Weight loss without trying.  Diarrhea. Signs and symptoms due to nervous system damage include:  Dizziness.  Being unsteady while walking.  Tingling or numbness of hands and feet.  Irritability.  Depression.  Hallucinations or delusions.  Loss of smell. How is this diagnosed? In many cases, anemia may be found after you have a routine blood test. This condition may also be diagnosed based on your symptoms, having a family history of the condition, and a physical exam. You may also have tests to confirm the diagnosis. These may include blood tests to:  Count the different types of blood cells (complete blood count or CBC).  Test for different types of anemia.  Check for proteins made by your immune system (antibodies) that attack parietal cells. Almost all people with pernicious anemia have these antibodies.  Check your B12 level. How is this treated? This condition may be treated with vitamin B12 replacement. This may include:  Injections of vitamin B12. This is the most common treatment. You will also have your blood level of B12 checked regularly. After you have a normal level of vitamin B12, and the anemia has been corrected, the injections can be given about once a month.  Taking a pill by mouth that contains a large dose of vitamin B12.  Using a spray that you breathe in through your nose (nasal spray). A vitamin B12 nasal spray may be used to treat people who are not able to swallow supplements.  Long-term monitoring and regular visits to a   health care provider. If the condition is detected and treatment is started in the early stages, most people do not develop complications. Treatment reverses the condition and prevents future anemia, but it must be continued for life. Having pernicious anemia also puts you at higher risk for stomach cancer. Follow these instructions at home:  Take  over-the-counter and prescription medicines only as told by your health care provider.  Return to your normal activities as told by your health care provider. Ask your health care provider what activities are safe for you.  Keep all follow-up visits as told by your health care provider. This is important. Contact a health care provider if:  You develop new symptoms.  Your symptoms return or get worse after treatment.  You have stomach pain.  You feel full after eating a small meal.  You have trouble swallowing. Get help right away if:  You have chest pain.  You have a rapid heartbeat.  You have dizziness or you faint.  You have trouble breathing.  You have a feeling as if your heart is skipping beats or fluttering (palpitations).  You have pain, swelling, or redness in an arm or leg.  You cough up blood. These symptoms may represent a serious problem that is an emergency. Do not wait to see if the symptoms will go away. Get medical help right away. Call your local emergency services (911 in the [place] Do not drive yourself to the hospital. Summary  Pernicious anemia happens when your body cannot make enough red blood cells due to vitamin B12 deficiency.  This condition is usually caused by an autoimmune disease that attacks stomach cells that produce intrinsic factor (IF). You need IF to absorb vitamin B12.  Pernicious anemia symptoms may take many years to develop.  This condition is sometimes discovered during routine blood testing.  Treatment of this condition includes vitamin B12 replacement for life. This information is not intended to replace advice given to you by your health care provider. Make sure you discuss any questions you have with your health care provider. Document Revised: 06/06/2018 Document Reviewed: 04/07/2018 Elsevier Patient Education  2020 [employer]  

## 2019-02-21 NOTE — Progress Notes (Signed)
I connected with  Jennifer Allen on 02/21/19 by a video enabled telemedicine application and verified that I am speaking with the correct person using two identifiers.  54 year old female is in virtually to discuss labs. Reports having fatigue and headaches which prompted Dr. Ardyth Harps to check labs. She was found to have a deficiency in B12. Lab value 151. She reports a history of pernicious anemia in her mother and father who take inections  O: A&O, NAD  A: B12 deficiency  P: B12 injection once weekly x 4 weeks then monthly. Will recheck in 6 weeks. Call with questions or concerns     I discussed the limitations of evaluation and management by telemedicine. The patient expressed understanding and agreed to proceed.

## 2019-02-22 ENCOUNTER — Other Ambulatory Visit: Payer: Self-pay

## 2019-02-22 ENCOUNTER — Ambulatory Visit (INDEPENDENT_AMBULATORY_CARE_PROVIDER_SITE_OTHER): Payer: 59 | Admitting: *Deleted

## 2019-02-22 DIAGNOSIS — Z23 Encounter for immunization: Secondary | ICD-10-CM

## 2019-02-22 DIAGNOSIS — D518 Other vitamin B12 deficiency anemias: Secondary | ICD-10-CM

## 2019-02-22 NOTE — Progress Notes (Signed)
Per orders of Dr. Hernandez, injection of shingrix given by Rachel Vereen. Patient tolerated injection well. 

## 2019-02-24 ENCOUNTER — Encounter: Payer: Self-pay | Admitting: Internal Medicine

## 2019-03-07 ENCOUNTER — Ambulatory Visit
Admission: RE | Admit: 2019-03-07 | Discharge: 2019-03-07 | Disposition: A | Discharge status: Home / Self Care | Payer: 59 | Source: Ambulatory Visit | Attending: Internal Medicine | Admitting: Internal Medicine

## 2019-03-07 ENCOUNTER — Other Ambulatory Visit: Payer: Self-pay

## 2019-03-07 DIAGNOSIS — Z1239 Encounter for other screening for malignant neoplasm of breast: Secondary | ICD-10-CM

## 2019-03-12 ENCOUNTER — Encounter: Payer: Self-pay | Admitting: Internal Medicine

## 2019-03-12 DIAGNOSIS — D510 Vitamin B12 deficiency anemia due to intrinsic factor deficiency: Secondary | ICD-10-CM

## 2019-03-15 ENCOUNTER — Other Ambulatory Visit: Payer: Self-pay

## 2019-03-16 ENCOUNTER — Other Ambulatory Visit: Payer: Self-pay

## 2019-03-17 ENCOUNTER — Other Ambulatory Visit (INDEPENDENT_AMBULATORY_CARE_PROVIDER_SITE_OTHER): Payer: 59

## 2019-03-17 DIAGNOSIS — D518 Other vitamin B12 deficiency anemias: Secondary | ICD-10-CM | POA: Diagnosis not present

## 2019-03-17 LAB — VITAMIN B12: Vitamin B-12: 725 pg/mL (ref 211–911)

## 2019-04-05 ENCOUNTER — Other Ambulatory Visit: Payer: 59

## 2019-04-16 ENCOUNTER — Other Ambulatory Visit: Payer: Self-pay | Admitting: Internal Medicine

## 2019-05-27 ENCOUNTER — Other Ambulatory Visit: Payer: Self-pay | Admitting: Internal Medicine

## 2021-01-13 DIAGNOSIS — R69 Illness, unspecified: Secondary | ICD-10-CM | POA: Diagnosis not present

## 2021-01-29 DIAGNOSIS — R69 Illness, unspecified: Secondary | ICD-10-CM | POA: Diagnosis not present

## 2021-02-12 DIAGNOSIS — R69 Illness, unspecified: Secondary | ICD-10-CM | POA: Diagnosis not present

## 2021-02-18 DIAGNOSIS — R69 Illness, unspecified: Secondary | ICD-10-CM | POA: Diagnosis not present

## 2021-03-11 DIAGNOSIS — R69 Illness, unspecified: Secondary | ICD-10-CM | POA: Diagnosis not present

## 2021-03-25 DIAGNOSIS — R69 Illness, unspecified: Secondary | ICD-10-CM | POA: Diagnosis not present

## 2021-04-08 DIAGNOSIS — R69 Illness, unspecified: Secondary | ICD-10-CM | POA: Diagnosis not present

## 2021-04-25 DIAGNOSIS — R69 Illness, unspecified: Secondary | ICD-10-CM | POA: Diagnosis not present

## 2021-05-06 DIAGNOSIS — R69 Illness, unspecified: Secondary | ICD-10-CM | POA: Diagnosis not present

## 2021-05-15 DIAGNOSIS — R69 Illness, unspecified: Secondary | ICD-10-CM | POA: Diagnosis not present

## 2021-05-21 DIAGNOSIS — R69 Illness, unspecified: Secondary | ICD-10-CM | POA: Diagnosis not present

## 2021-06-03 DIAGNOSIS — R69 Illness, unspecified: Secondary | ICD-10-CM | POA: Diagnosis not present

## 2021-06-10 DIAGNOSIS — R69 Illness, unspecified: Secondary | ICD-10-CM | POA: Diagnosis not present

## 2021-06-18 DIAGNOSIS — D518 Other vitamin B12 deficiency anemias: Secondary | ICD-10-CM | POA: Diagnosis not present

## 2021-06-18 DIAGNOSIS — F32A Depression, unspecified: Secondary | ICD-10-CM | POA: Diagnosis not present

## 2021-06-18 DIAGNOSIS — Z0000 Encounter for general adult medical examination without abnormal findings: Secondary | ICD-10-CM | POA: Diagnosis not present

## 2021-06-19 DIAGNOSIS — Z0000 Encounter for general adult medical examination without abnormal findings: Secondary | ICD-10-CM | POA: Diagnosis not present

## 2021-06-25 DIAGNOSIS — R69 Illness, unspecified: Secondary | ICD-10-CM | POA: Diagnosis not present

## 2021-07-07 DIAGNOSIS — R69 Illness, unspecified: Secondary | ICD-10-CM | POA: Diagnosis not present

## 2021-07-22 DIAGNOSIS — R69 Illness, unspecified: Secondary | ICD-10-CM | POA: Diagnosis not present

## 2021-08-04 DIAGNOSIS — R69 Illness, unspecified: Secondary | ICD-10-CM | POA: Diagnosis not present

## 2021-08-26 DIAGNOSIS — R69 Illness, unspecified: Secondary | ICD-10-CM | POA: Diagnosis not present

## 2021-09-15 DIAGNOSIS — R69 Illness, unspecified: Secondary | ICD-10-CM | POA: Diagnosis not present

## 2022-01-22 DIAGNOSIS — Z124 Encounter for screening for malignant neoplasm of cervix: Secondary | ICD-10-CM | POA: Diagnosis not present

## 2022-01-22 DIAGNOSIS — E538 Deficiency of other specified B group vitamins: Secondary | ICD-10-CM | POA: Diagnosis not present

## 2022-01-22 DIAGNOSIS — M79645 Pain in left finger(s): Secondary | ICD-10-CM | POA: Diagnosis not present

## 2022-01-22 DIAGNOSIS — Z6825 Body mass index (BMI) 25.0-25.9, adult: Secondary | ICD-10-CM | POA: Diagnosis not present

## 2022-01-22 DIAGNOSIS — M79644 Pain in right finger(s): Secondary | ICD-10-CM | POA: Diagnosis not present

## 2022-01-22 DIAGNOSIS — Z1231 Encounter for screening mammogram for malignant neoplasm of breast: Secondary | ICD-10-CM | POA: Diagnosis not present

## 2022-01-22 DIAGNOSIS — E042 Nontoxic multinodular goiter: Secondary | ICD-10-CM | POA: Diagnosis not present

## 2022-01-22 DIAGNOSIS — R519 Headache, unspecified: Secondary | ICD-10-CM | POA: Diagnosis not present

## 2022-01-22 DIAGNOSIS — F329 Major depressive disorder, single episode, unspecified: Secondary | ICD-10-CM | POA: Diagnosis not present

## 2022-02-06 DIAGNOSIS — Z01419 Encounter for gynecological examination (general) (routine) without abnormal findings: Secondary | ICD-10-CM | POA: Diagnosis not present

## 2022-02-06 DIAGNOSIS — Z124 Encounter for screening for malignant neoplasm of cervix: Secondary | ICD-10-CM | POA: Diagnosis not present

## 2022-02-06 DIAGNOSIS — Z1151 Encounter for screening for human papillomavirus (HPV): Secondary | ICD-10-CM | POA: Diagnosis not present

## 2022-02-06 DIAGNOSIS — R69 Illness, unspecified: Secondary | ICD-10-CM | POA: Diagnosis not present

## 2022-02-12 IMAGING — MG DIGITAL SCREENING BILAT W/ CAD
4 series · 4 of 4 positions shown · non-contrast
Comparison: Previous exam(s).

CLINICAL DATA: Screening.

EXAM:
DIGITAL SCREENING BILATERAL MAMMOGRAM WITH CAD

[L MLO]
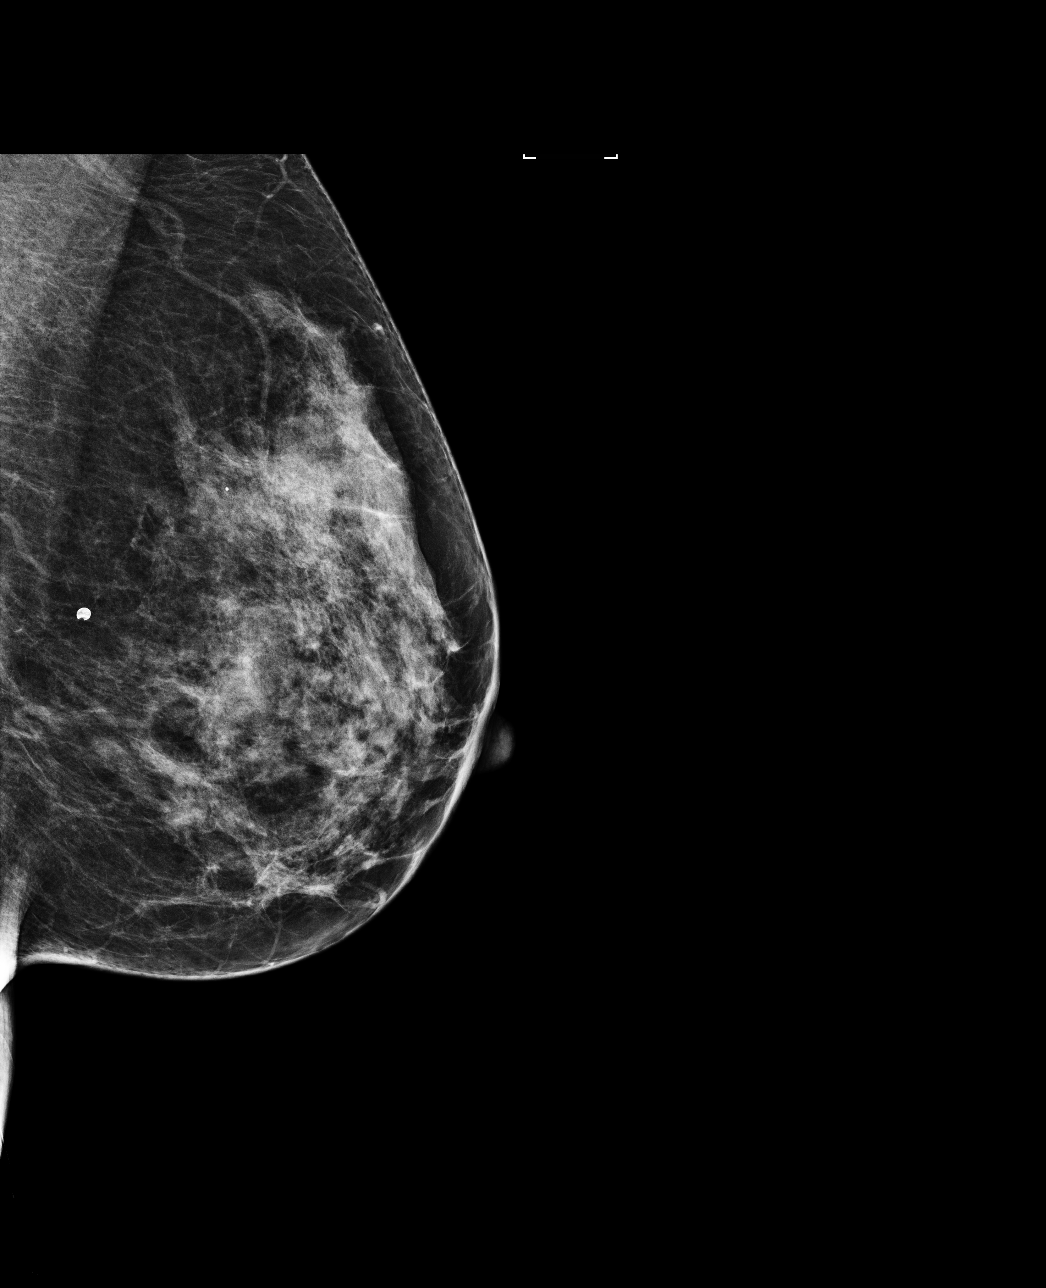

[R MLO]
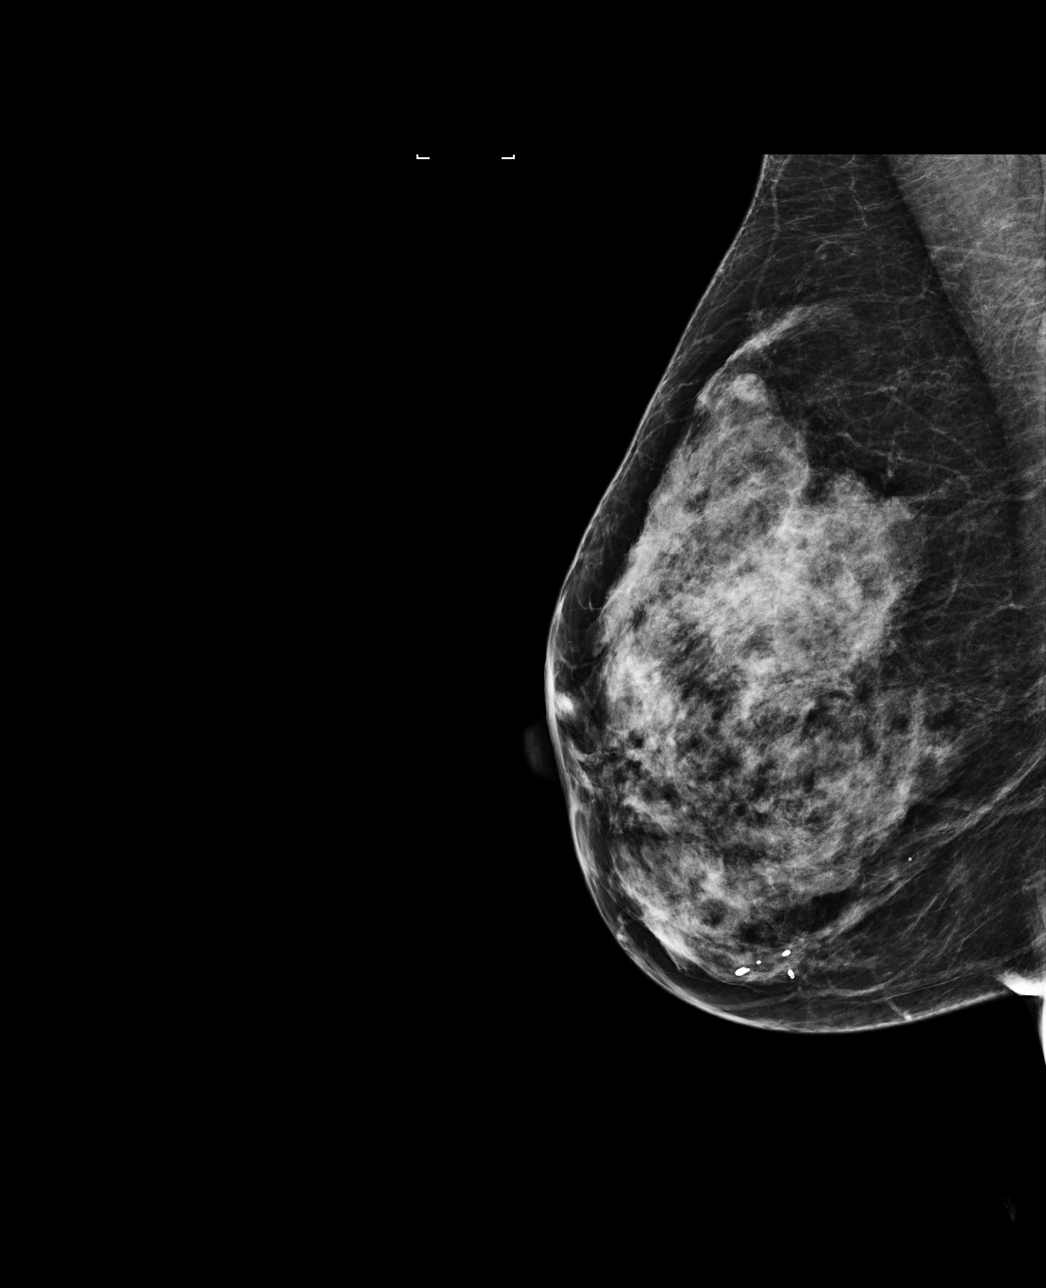

[R CC]
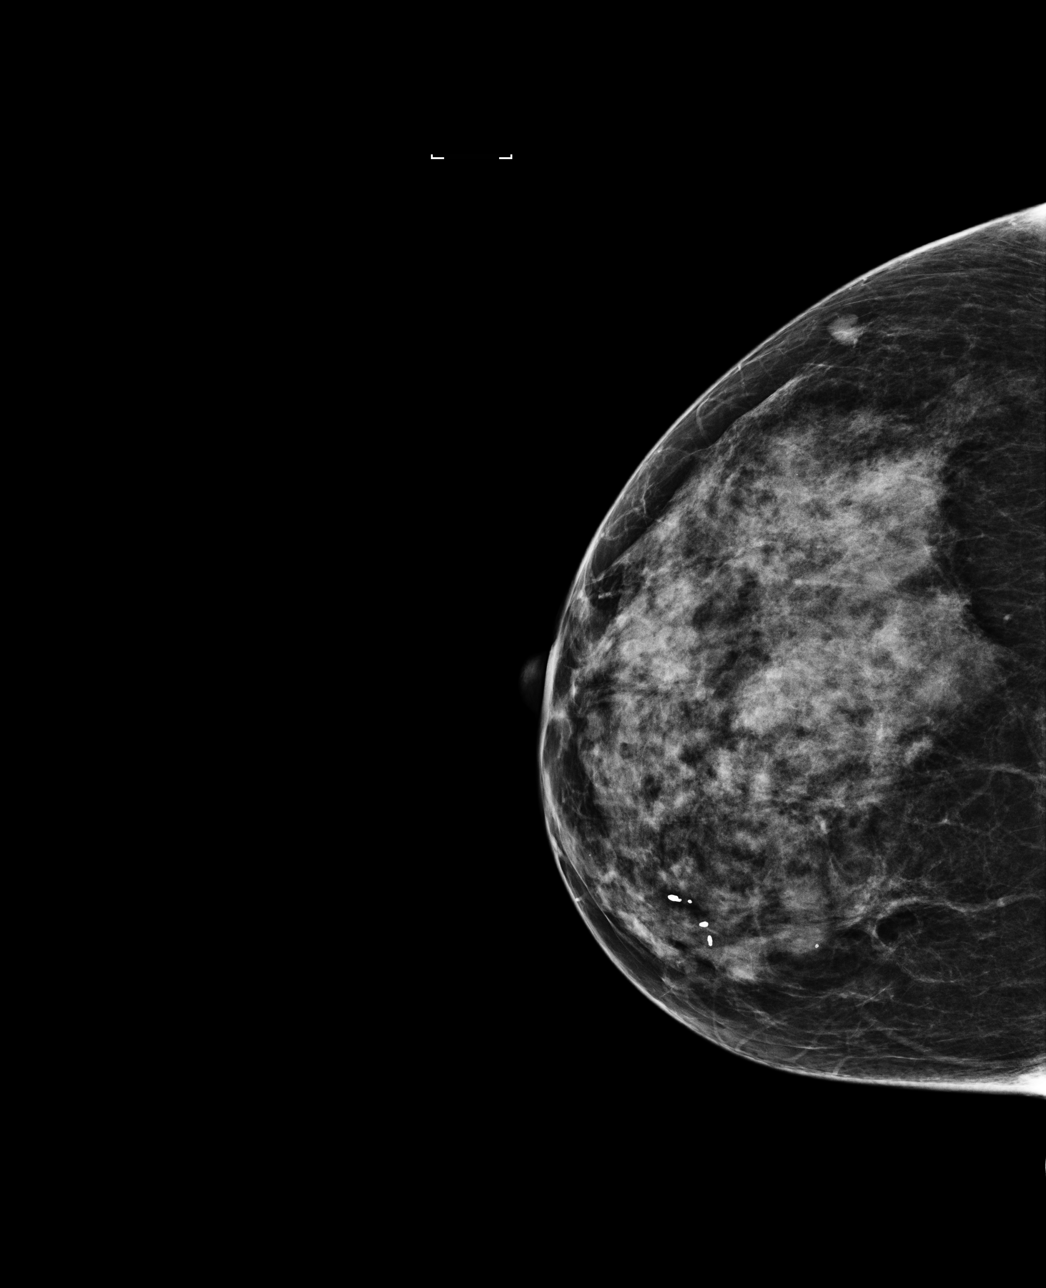

[L CC]
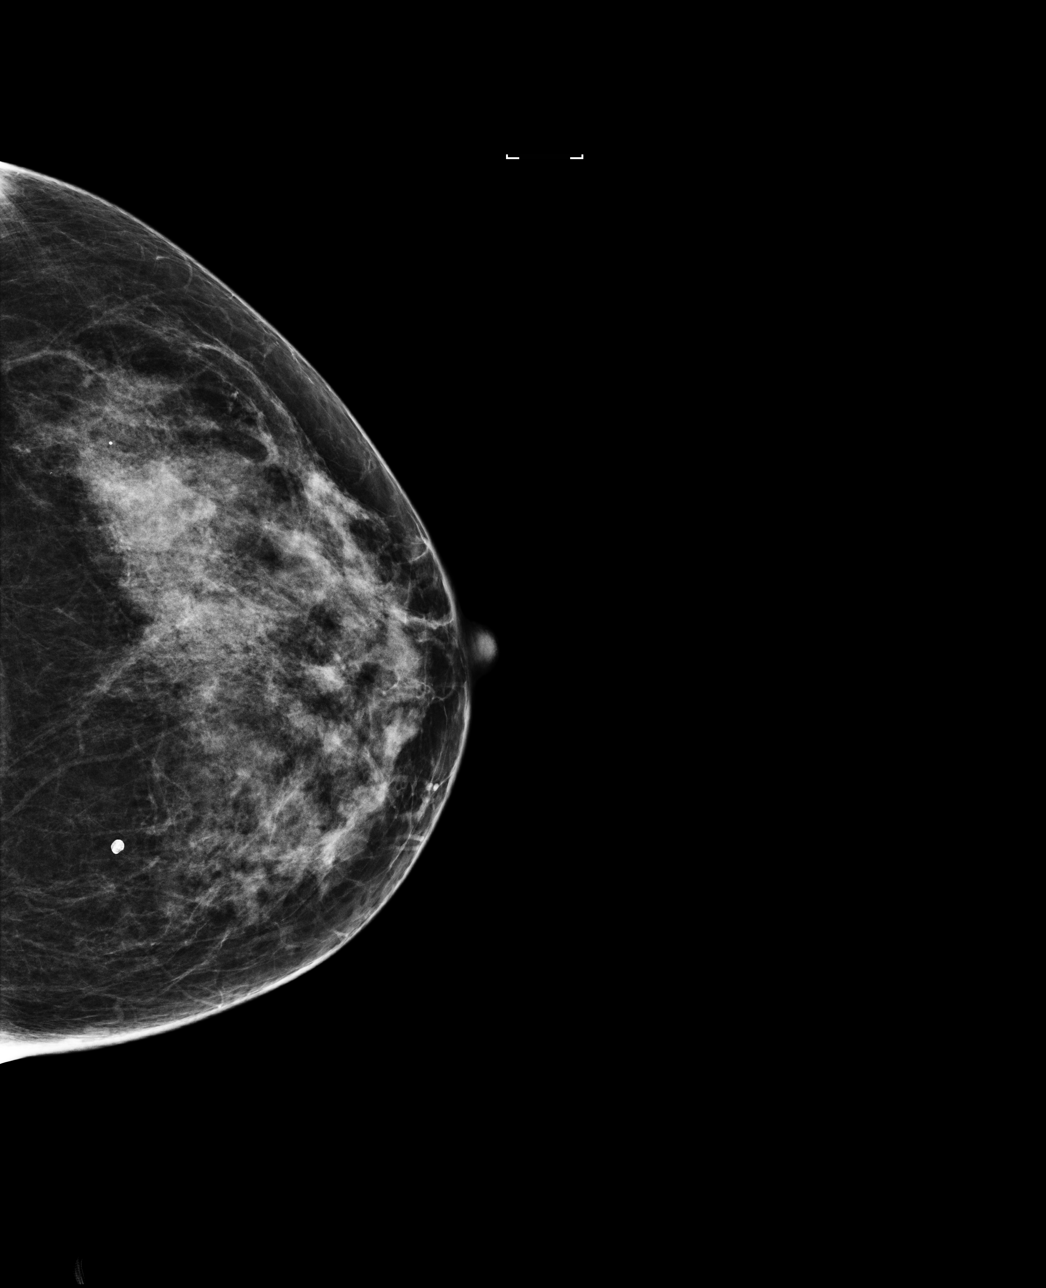

[4 of 4 positions shown; findings below may reference images not displayed]

ACR Breast Density Category c: The breast tissue is heterogeneously
dense, which may obscure small masses.
FINDINGS: There are no findings suspicious for malignancy. Images were
processed with CAD.
IMPRESSION: No mammographic evidence of malignancy. A result letter of this
screening mammogram will be mailed directly to the patient.

RECOMMENDATION:
Screening mammogram in one year. (Code:YJ-2-FEZ)

BI-RADS CATEGORY  1: Negative.

## 2022-04-21 DIAGNOSIS — F4323 Adjustment disorder with mixed anxiety and depressed mood: Secondary | ICD-10-CM | POA: Diagnosis not present

## 2022-04-22 ENCOUNTER — Ambulatory Visit: Payer: 59 | Admitting: Family Medicine

## 2022-04-27 ENCOUNTER — Encounter: Payer: Self-pay | Admitting: Family Medicine

## 2022-04-27 ENCOUNTER — Ambulatory Visit: Payer: 59 | Admitting: Family Medicine

## 2022-04-27 ENCOUNTER — Other Ambulatory Visit: Payer: Self-pay

## 2022-04-27 VITALS — BP 102/78 | Ht 65.5 in | Wt 156.0 lb

## 2022-04-27 DIAGNOSIS — M180 Bilateral primary osteoarthritis of first carpometacarpal joints: Secondary | ICD-10-CM | POA: Diagnosis not present

## 2022-04-27 DIAGNOSIS — Q796 Ehlers-Danlos syndrome, unspecified: Secondary | ICD-10-CM | POA: Diagnosis not present

## 2022-04-27 DIAGNOSIS — M23303 Other meniscus derangements, unspecified medial meniscus, right knee: Secondary | ICD-10-CM

## 2022-04-27 DIAGNOSIS — M25561 Pain in right knee: Secondary | ICD-10-CM

## 2022-04-27 NOTE — Progress Notes (Signed)
  Jennifer Allen - 57 y.o. female MRN [id number]  Date of birth: [birth year]  SUBJECTIVE:  Including CC & ROS.  No chief complaint on file.   Jennifer Allen is a 57 y.o. female that is presenting with acute on chronic right knee pain and questions about hypermobility as well as bilateral thumb pain.  Her daughter was recently diagnosed with Ehlers-Danlos.  She has multiple joint pain.  She has a right knee that is causing her most discomfort.  She has an injury from years ago.  No history of surgery.  She has pain at the base of each thumb.    Review of Systems See HPI   HISTORY: Past Medical, Surgical, Social, and Family History Reviewed & Updated per EMR.   Pertinent Historical Findings include:  Past Medical History:  Diagnosis Date   Allergic rhinitis, cause unspecified    HYPERTHYROIDISM 10/28/2009   incidental CPX dx   POSTPARTUM DEPRESSION 2008    Past Surgical History:  Procedure Laterality Date   APPENDECTOMY  1995   CESAREAN SECTION  2008   TONSILLECTOMY  1984     PHYSICAL EXAM:  VS: BP 102/78 (BP Location: Left Arm, Patient Position: Sitting)   Ht 5' 5.5" (1.664 m)   Wt 156 lb (70.8 kg)   LMP 12/05/2012   BMI 25.56 kg/m  Physical Exam Gen: NAD, alert, cooperative with exam, well-appearing MSK:  Neurovascularly intact    Limited ultrasound: Right knee pain and right and left thumb pain:  No effusion the suprapatellar pouch. Normal-appearing quadricep and patellar tendon. Outpouching of the medial meniscus. No changes of the lateral meniscus  Right and left thumb: Degenerative changes appreciated of the [facility] joint.  This is more significant on left compared to the right. There is associated effusion  Summary: Findings consistent with degenerative changes of the medial meniscus and degenerative changes of the [facility] joint.  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  Has degenerative changes appreciated with the left being worse than the right. -Counseled on home exercise therapy and supportive care. -Counseled on over-the-counter Voltaren. -Could consider injection or bracing  Ehlers-Danlos syndrome Beighton score 6/9.  Likely contributing to her multiple joint pain. -Counseled supportive care. 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  She has degenerative changes within the medial meniscus. -Counseled On home exercise therapy and supportive care. -Counseled on compression. -Could consider physical therapy or injection

## 2022-04-27 NOTE — Assessment & Plan Note (Signed)
Beighton score 6/9.  Likely contributing to her multiple joint pain. -Counseled supportive care. -Could consider physical therapy

## 2022-04-27 NOTE — Patient Instructions (Signed)
Nice to meet you Please use ice as needed  You can try over the counter voltaren  Please try the exercises  You can consider body helix for the knee   Please send me a message in MyChart with any questions or updates.  Please see me back as needed.   --Dr. Jordan Likes

## 2022-04-27 NOTE — Assessment & Plan Note (Signed)
Acute on chronic in nature.  She has degenerative changes within the medial meniscus. -Counseled On home exercise therapy and supportive care. -Counseled on compression. -Could consider physical therapy or injection

## 2022-04-27 NOTE — Assessment & Plan Note (Signed)
Acute on chronic in nature.  Has degenerative changes appreciated with the left being worse than the right. -Counseled on home exercise therapy and supportive care. -Counseled on over-the-counter Voltaren. -Could consider injection or bracing

## 2022-04-29 DIAGNOSIS — F4323 Adjustment disorder with mixed anxiety and depressed mood: Secondary | ICD-10-CM | POA: Diagnosis not present

## 2022-05-05 DIAGNOSIS — F4323 Adjustment disorder with mixed anxiety and depressed mood: Secondary | ICD-10-CM | POA: Diagnosis not present

## 2022-05-09 DIAGNOSIS — F324 Major depressive disorder, single episode, in partial remission: Secondary | ICD-10-CM | POA: Diagnosis not present

## 2022-05-09 DIAGNOSIS — Z809 Family history of malignant neoplasm, unspecified: Secondary | ICD-10-CM | POA: Diagnosis not present

## 2022-05-09 DIAGNOSIS — Z8249 Family history of ischemic heart disease and other diseases of the circulatory system: Secondary | ICD-10-CM | POA: Diagnosis not present

## 2022-05-09 DIAGNOSIS — I10 Essential (primary) hypertension: Secondary | ICD-10-CM | POA: Diagnosis not present

## 2022-05-09 DIAGNOSIS — E538 Deficiency of other specified B group vitamins: Secondary | ICD-10-CM | POA: Diagnosis not present

## 2022-05-09 DIAGNOSIS — D510 Vitamin B12 deficiency anemia due to intrinsic factor deficiency: Secondary | ICD-10-CM | POA: Diagnosis not present

## 2022-05-09 DIAGNOSIS — D8481 Immunodeficiency due to conditions classified elsewhere: Secondary | ICD-10-CM | POA: Diagnosis not present

## 2022-05-09 DIAGNOSIS — F411 Generalized anxiety disorder: Secondary | ICD-10-CM | POA: Diagnosis not present

## 2022-05-12 DIAGNOSIS — F4323 Adjustment disorder with mixed anxiety and depressed mood: Secondary | ICD-10-CM | POA: Diagnosis not present

## 2022-05-19 DIAGNOSIS — F4323 Adjustment disorder with mixed anxiety and depressed mood: Secondary | ICD-10-CM | POA: Diagnosis not present

## 2022-05-21 DIAGNOSIS — F411 Generalized anxiety disorder: Secondary | ICD-10-CM | POA: Diagnosis not present

## 2022-05-28 DIAGNOSIS — F411 Generalized anxiety disorder: Secondary | ICD-10-CM | POA: Diagnosis not present

## 2022-06-17 DIAGNOSIS — F411 Generalized anxiety disorder: Secondary | ICD-10-CM | POA: Diagnosis not present

## 2022-06-23 DIAGNOSIS — F411 Generalized anxiety disorder: Secondary | ICD-10-CM | POA: Diagnosis not present

## 2022-07-02 DIAGNOSIS — F411 Generalized anxiety disorder: Secondary | ICD-10-CM | POA: Diagnosis not present

## 2022-07-15 DIAGNOSIS — F411 Generalized anxiety disorder: Secondary | ICD-10-CM | POA: Diagnosis not present

## 2022-07-22 DIAGNOSIS — F411 Generalized anxiety disorder: Secondary | ICD-10-CM | POA: Diagnosis not present

## 2022-07-28 DIAGNOSIS — R509 Fever, unspecified: Secondary | ICD-10-CM | POA: Diagnosis not present

## 2022-07-28 DIAGNOSIS — J029 Acute pharyngitis, unspecified: Secondary | ICD-10-CM | POA: Diagnosis not present

## 2022-07-28 DIAGNOSIS — Z6825 Body mass index (BMI) 25.0-25.9, adult: Secondary | ICD-10-CM | POA: Diagnosis not present

## 2022-07-29 DIAGNOSIS — B359 Dermatophytosis, unspecified: Secondary | ICD-10-CM | POA: Diagnosis not present

## 2022-07-29 DIAGNOSIS — L02224 Furuncle of groin: Secondary | ICD-10-CM | POA: Diagnosis not present

## 2022-07-29 DIAGNOSIS — Z6825 Body mass index (BMI) 25.0-25.9, adult: Secondary | ICD-10-CM | POA: Diagnosis not present

## 2022-08-03 DIAGNOSIS — F411 Generalized anxiety disorder: Secondary | ICD-10-CM | POA: Diagnosis not present

## 2022-08-13 DIAGNOSIS — F411 Generalized anxiety disorder: Secondary | ICD-10-CM | POA: Diagnosis not present

## 2022-08-24 DIAGNOSIS — F411 Generalized anxiety disorder: Secondary | ICD-10-CM | POA: Diagnosis not present

## 2022-08-26 DIAGNOSIS — Z6825 Body mass index (BMI) 25.0-25.9, adult: Secondary | ICD-10-CM | POA: Diagnosis not present

## 2022-08-26 DIAGNOSIS — B359 Dermatophytosis, unspecified: Secondary | ICD-10-CM | POA: Diagnosis not present

## 2022-08-26 DIAGNOSIS — K0889 Other specified disorders of teeth and supporting structures: Secondary | ICD-10-CM | POA: Diagnosis not present

## 2022-09-02 DIAGNOSIS — F411 Generalized anxiety disorder: Secondary | ICD-10-CM | POA: Diagnosis not present

## 2022-09-16 DIAGNOSIS — F411 Generalized anxiety disorder: Secondary | ICD-10-CM | POA: Diagnosis not present

## 2022-09-30 DIAGNOSIS — F411 Generalized anxiety disorder: Secondary | ICD-10-CM | POA: Diagnosis not present

## 2022-10-14 DIAGNOSIS — F411 Generalized anxiety disorder: Secondary | ICD-10-CM | POA: Diagnosis not present

## 2022-10-19 DIAGNOSIS — G8929 Other chronic pain: Secondary | ICD-10-CM | POA: Diagnosis not present

## 2022-10-19 DIAGNOSIS — K089 Disorder of teeth and supporting structures, unspecified: Secondary | ICD-10-CM | POA: Diagnosis not present

## 2022-10-28 DIAGNOSIS — F411 Generalized anxiety disorder: Secondary | ICD-10-CM | POA: Diagnosis not present

## 2022-11-11 DIAGNOSIS — F411 Generalized anxiety disorder: Secondary | ICD-10-CM | POA: Diagnosis not present

## 2022-11-25 DIAGNOSIS — F411 Generalized anxiety disorder: Secondary | ICD-10-CM | POA: Diagnosis not present

## 2022-12-22 DIAGNOSIS — F411 Generalized anxiety disorder: Secondary | ICD-10-CM | POA: Diagnosis not present

## 2023-11-26 ENCOUNTER — Ambulatory Visit: Admitting: Family Medicine

## 2023-11-26 VITALS — BP 112/74 | HR 88 | Ht 65.5 in | Wt 169.0 lb

## 2023-11-26 DIAGNOSIS — Q796 Ehlers-Danlos syndrome, unspecified: Secondary | ICD-10-CM | POA: Diagnosis not present

## 2023-11-26 DIAGNOSIS — M23303 Other meniscus derangements, unspecified medial meniscus, right knee: Secondary | ICD-10-CM

## 2023-11-26 DIAGNOSIS — M180 Bilateral primary osteoarthritis of first carpometacarpal joints: Secondary | ICD-10-CM | POA: Diagnosis not present

## 2023-11-26 MED ORDER — FLUOXETINE HCL 20 MG PO CAPS: 20.0000 mg | ORAL_CAPSULE | Freq: Every morning | ORAL | 3 refills | Status: AC

## 2023-11-26 NOTE — Patient Instructions (Signed)
 Thank you for coming in today.   Soft bite guard.   Please use Voltaren gel (Generic Diclofenac Gel) up to 4x daily for pain as needed.  This is available over-the-counter as both the name brand Voltaren gel and the generic diclofenac gel.   Xray and PT/OT if needed.   Let me know if you want me to order it.   CMC thumb brace.

## 2023-11-26 NOTE — Progress Notes (Signed)
   [name], PhD, LAT, ATC acting as a scribe for Artist Lloyd, MD.  Jennifer Allen is a 58 y.o. female who presents to [facility] today for EDS and rx management. Pt was previously seen by Dr. Chick on 04/27/22.   She notes right knee pain and bilateral thumb pain left worse than right.  MS: Chronic joint pain, Chronic wide spread muscle pain, and Joint Hypermobility, R knee, R shoulder, bilat thumbs Skin/Immune reactions: Fragile Skin that tears easily and Easy Bruising / Bleeding Neurological: Headache  Migraine, Cognitive Dysfunction / Brain Fog, and Sleep Disturbance ANS: Fatigue, Exercise / Temperature Intolerance , and Anxiety / Panic Respiratory: none CV: prior dx of mitral valve prolapse, no longer audible GI:  IBS with Diarrhea or Constipation Genitourinary: Incontinence Hands & Feet:   Treatments tried: HEP from prior provider, [facility]: Dr. Chad McIntyre [phone]  Pertinent review of systems: No fevers or chills  Relevant historical information: Ehlers-Danlos syndrome.  Thumb pain.   Exam:  BP 112/74   Pulse 88   Ht 5' 5.5 (1.664 m)   Wt 169 lb (76.7 kg)   LMP 12/05/2012   SpO2 99%   BMI 27.70 kg/m  General: Well Developed, well nourished, and in no acute distress.   MSK: Thumbs bilaterally bossing first CMC.  Tender palpation in this region.  Normal thumb motion.  Knees bilaterally normal.  Normal motion.  Intact strength.    Lab and Radiology Results No results found for this or any previous visit (from the past 72 hours). No results found.     Assessment and Plan: 58 y.o. female with bilateral thumb and right knee pain.  These are chronic ongoing issues secondary to [facility] DJD and a degenerative medial meniscus tear.  We discussed options.  Continue home exercise program for now but consider adding formal physical therapy in the future for this or other musculoskeletal issues  attributable to her otherwise currently ongoing Ehlers-Danlos syndrome.  We did talk about some specific strategies and goals for EDS including self-care guidance for musculoskeletal pain POTS and mast cell activation syndrome.  Additionally she notes she has a chronic prescription for fluoxetine  and would request a refill for that.  This has been helpful.  I do recommend establishing care with a primary care provider for this in the future.  I did provide some recommendations for PCPs.  We also talked about thumb bracing and a soft bite guard.  Can add PT/OT in the future if needed with a phone call or MyChart message  PDMP not reviewed this encounter. No orders of the defined types were placed in this encounter.  Meds ordered this encounter  Medications   FLUoxetine  (PROZAC ) 20 MG capsule    Sig: Take 1 capsule (20 mg total) by mouth every morning.    Dispense:  90 capsule    Refill:  3     Discussed warning signs or symptoms. Please see discharge instructions. Patient expresses understanding.   The above documentation has been reviewed and is accurate and complete Artist Lloyd, M.D.
